# Patient Record
Sex: Male | Born: 1950 | Race: White | Hispanic: No | Marital: Married | Smoking: Never smoker
Health system: Southern US, Community
[De-identification: ages and names within clinical notes are randomized; demographics above are authoritative.]

## PROBLEM LIST (undated history)

## (undated) DIAGNOSIS — K219 Gastro-esophageal reflux disease without esophagitis: Secondary | ICD-10-CM

## (undated) DIAGNOSIS — T7840XA Allergy, unspecified, initial encounter: Secondary | ICD-10-CM

## (undated) DIAGNOSIS — I10 Essential (primary) hypertension: Secondary | ICD-10-CM

## (undated) DIAGNOSIS — J019 Acute sinusitis, unspecified: Secondary | ICD-10-CM

## (undated) DIAGNOSIS — Z8619 Personal history of other infectious and parasitic diseases: Secondary | ICD-10-CM

## (undated) DIAGNOSIS — R05 Cough: Secondary | ICD-10-CM

## (undated) DIAGNOSIS — E785 Hyperlipidemia, unspecified: Secondary | ICD-10-CM

## (undated) DIAGNOSIS — R7309 Other abnormal glucose: Secondary | ICD-10-CM

## (undated) DIAGNOSIS — R059 Cough, unspecified: Secondary | ICD-10-CM

## (undated) DIAGNOSIS — Z87898 Personal history of other specified conditions: Secondary | ICD-10-CM

## (undated) DIAGNOSIS — E119 Type 2 diabetes mellitus without complications: Secondary | ICD-10-CM

## (undated) DIAGNOSIS — D649 Anemia, unspecified: Secondary | ICD-10-CM

## (undated) DIAGNOSIS — J309 Allergic rhinitis, unspecified: Secondary | ICD-10-CM

## (undated) DIAGNOSIS — M2550 Pain in unspecified joint: Secondary | ICD-10-CM

## (undated) DIAGNOSIS — Z8719 Personal history of other diseases of the digestive system: Secondary | ICD-10-CM

## (undated) DIAGNOSIS — K859 Acute pancreatitis without necrosis or infection, unspecified: Secondary | ICD-10-CM

## (undated) DIAGNOSIS — E039 Hypothyroidism, unspecified: Secondary | ICD-10-CM

## (undated) DIAGNOSIS — E669 Obesity, unspecified: Secondary | ICD-10-CM

## (undated) HISTORY — DX: Cough, unspecified: R05.9

## (undated) HISTORY — DX: Hyperlipidemia, unspecified: E78.5

## (undated) HISTORY — DX: Essential (primary) hypertension: I10

## (undated) HISTORY — DX: Other abnormal glucose: R73.09

## (undated) HISTORY — PX: CHOLECYSTECTOMY: SHX55

## (undated) HISTORY — DX: Type 2 diabetes mellitus without complications: E11.9

## (undated) HISTORY — DX: Obesity, unspecified: E66.9

## (undated) HISTORY — DX: Anemia, unspecified: D64.9

## (undated) HISTORY — DX: Acute pancreatitis without necrosis or infection, unspecified: K85.90

## (undated) HISTORY — DX: Cough: R05

## (undated) HISTORY — DX: Acute sinusitis, unspecified: J01.90

## (undated) HISTORY — DX: Allergy, unspecified, initial encounter: T78.40XA

## (undated) HISTORY — DX: Personal history of other infectious and parasitic diseases: Z86.19

## (undated) HISTORY — DX: Allergic rhinitis, unspecified: J30.9

## (undated) HISTORY — DX: Personal history of other specified conditions: Z87.898

## (undated) HISTORY — DX: Gastro-esophageal reflux disease without esophagitis: K21.9

---

## 1998-12-20 ENCOUNTER — Emergency Department (HOSPITAL_COMMUNITY): Admission: EM | Admit: 1998-12-20 | Discharge: 1998-12-20 | Payer: Self-pay | Admitting: Emergency Medicine

## 1998-12-20 ENCOUNTER — Encounter: Payer: Self-pay | Admitting: Emergency Medicine

## 2000-02-09 ENCOUNTER — Ambulatory Visit (HOSPITAL_COMMUNITY): Admission: RE | Admit: 2000-02-09 | Discharge: 2000-02-09 | Payer: Self-pay | Admitting: Gastroenterology

## 2000-02-09 ENCOUNTER — Encounter: Payer: Self-pay | Admitting: Gastroenterology

## 2000-03-22 ENCOUNTER — Encounter: Payer: Self-pay | Admitting: Gastroenterology

## 2000-03-22 ENCOUNTER — Ambulatory Visit (HOSPITAL_COMMUNITY): Admission: RE | Admit: 2000-03-22 | Discharge: 2000-03-22 | Payer: Self-pay | Admitting: Gastroenterology

## 2001-01-10 ENCOUNTER — Encounter: Payer: Self-pay | Admitting: Surgery

## 2001-01-10 ENCOUNTER — Observation Stay (HOSPITAL_COMMUNITY): Admission: RE | Admit: 2001-01-10 | Discharge: 2001-01-11 | Payer: Self-pay | Admitting: Surgery

## 2006-02-28 HISTORY — PX: OTHER SURGICAL HISTORY: SHX169

## 2012-06-04 ENCOUNTER — Telehealth (HOSPITAL_COMMUNITY): Payer: Self-pay | Admitting: Dietician

## 2012-06-04 NOTE — Telephone Encounter (Signed)
Called pt at 1600. Appointment set up for 06/13/12 at 0900.

## 2012-06-04 NOTE — Telephone Encounter (Signed)
Received referral via fax from [facility] (Dr. Rosezetta Schlatter) for dx: obesity, abnormal fasting glucose.

## 2012-06-13 ENCOUNTER — Encounter (HOSPITAL_COMMUNITY): Payer: Self-pay | Admitting: Dietician

## 2012-06-13 DIAGNOSIS — I10 Essential (primary) hypertension: Secondary | ICD-10-CM | POA: Insufficient documentation

## 2012-06-13 DIAGNOSIS — R059 Cough, unspecified: Secondary | ICD-10-CM | POA: Insufficient documentation

## 2012-06-13 DIAGNOSIS — K219 Gastro-esophageal reflux disease without esophagitis: Secondary | ICD-10-CM | POA: Insufficient documentation

## 2012-06-13 DIAGNOSIS — D649 Anemia, unspecified: Secondary | ICD-10-CM | POA: Insufficient documentation

## 2012-06-13 DIAGNOSIS — J309 Allergic rhinitis, unspecified: Secondary | ICD-10-CM | POA: Insufficient documentation

## 2012-06-13 DIAGNOSIS — R05 Cough: Secondary | ICD-10-CM | POA: Insufficient documentation

## 2012-06-13 DIAGNOSIS — J019 Acute sinusitis, unspecified: Secondary | ICD-10-CM | POA: Insufficient documentation

## 2012-06-13 NOTE — Progress Notes (Signed)
Outpatient Initial Nutrition Assessment  Date:06/13/2012   Appt Start Time: 0903  Referring Physician: Dr. Rosezetta Schlatter Reason for Visit: prediabetes, obesity  Nutrition Assessment:  Height: 5\' 7"  (170.2 cm)   Weight: 338 lb (153.316 kg)   IBW: 148# %IBW: 228% UBW: 335# %UBW: 99%  Body mass index is 52.93 kg/(m^2). Classified as extreme obesity, class III. Goal Weight: 304# (10% loss of current body weight) Weight hx: Pt reports UBW between 330-335#. He reports wt of 350# at his PCP appt a few weeks ago was his highest weight.   Estimated nutritional needs: 2300-2400 kcals daily, 123-154 grams protein daily, 2.3-2.4 L fluid daily  PMH:  Past Medical History  Diagnosis Date  . Sinusitis, acute   . HTN (hypertension)   . Cough   . Anemia   . Obesity   . Allergic rhinitis   . GERD (gastroesophageal reflux disease)     Medications:  Current Outpatient Rx  Name  Route  Sig  Dispense  Refill  . amLODipine (NORVASC) 5 MG tablet   Oral   Take 5 mg by mouth daily.         Marland Kitchen aspirin 81 MG tablet   Oral   Take 81 mg by mouth daily.         . benazepril-hydrochlorthiazide (LOTENSIN HCT) 20-12.5 MG per tablet   Oral   Take 1 tablet by mouth daily.         . cetirizine (ZYRTEC) 10 MG tablet   Oral   Take 10 mg by mouth daily.         . Flaxseed, Linseed, (FLAX SEED OIL) 1000 MG CAPS   Oral   Take 1,000 mg by mouth daily.         Marland Kitchen omeprazole (PRILOSEC) 20 MG capsule   Oral   Take 20 mg by mouth 2 (two) times daily.         Marland Kitchen terazosin (HYTRIN) 10 MG capsule   Oral   Take 10 mg by mouth at bedtime.         . thiamine (VITAMIN B-1) 100 MG tablet   Oral   Take 100 mg by mouth daily.           Labs: CMP  No results found for this basename: na, k, cl, co2, glucose, bun, creatinine, calcium, prot, albumin, ast, alt, alkphos, bilitot, gfrnonaa, gfraa    Lipid Panel  No results found for this basename: chol, trig, hdl, cholhdl, vldl, ldlcalc     No results  found for this basename: HGBA1C   No results found for this basename: GLUF, MICROALBUR, LDLCALC, CREATININE    No available labs.   Lifestyle/ social habits: George Christensen is a very pleasant gentleman who lives in the [place] area [place]) with his wife of 40 years, who is present with him today. He has 2 living adult children, one who lives in the [place] country area and the other who lives in [place]. He had a third child who passed away in his 6's. 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 He reports his stress level as "way down" since he retired. He reports he has always worked in high stress jobs and had a high stress level when he was working, especially in public works. He also spends a great deal of time taking care of his parents, who are in their 72's, and drives  them to their various medical appointments. He does not do any structured exercise regularly. He reports that he is active outdoors, participating in farming, hunting, fishing, and restoring old buildings on his property.   Nutrition hx/habits: George Christensen reports that he has been becoming more aware of his eating habits since he was diagnosed with prediabetes. He is very proud of his hypertensive control, reporting he achieved a level of 126/72 at his dentist's office last week. He expresses interest in losing weight and reports that a close friend of his was very successful with Weight Watchers. His beverages mainly consist of water, Gatorade, sweet tea, and cheerwine. He reports that he tries to eat 3 meals a day, but he will skip meals, especially if he is busy with a project during the day. He often eats ice cream at night. He goes out to eat at fast food establishments at least once a week. His wife expresses concern about him trying to eat healthier, as she finds it is more expensive.   Diet recall: Breakfast  (0600-0700): 2 pieces of toast, muscadine juice OR boiled chicken thigh; Lunch (1200-1300): fast food cheeseburger with sweet tea; Dinner: rotisserie chicoken, boxed macaroni and cheese, french fries, sweet tea; Dessert: peach cobbler and scoop of ice cream.   Nutrition Diagnosis: Incosnistent carbohydrate intake r/t nutrition-related knowledge deficit, skipping meals AEB reports of elevated blood glucose.   Nutrition Intervention: Nutrition rx: 1800-2000 NAS, diabetic diet; 3 meals per day; limit 1 starch per meal; limit snacks to fruit, vegetables, or yogurt; limit sweets; low calorie beverages only; physical activity as tolerated  Education/Counseling Provided: Educated pt on principles of diabetic diet and weight amangement. Discussed carbohydrate metabolism in relation to diabetes. Discussed principles of energy expenditure and how changes in diet and physical activity affect weight status. Discussed nutritional content of commonly eaten foods and suggested healthier alternatives Educated pt on plate method, portion sizes, and sources of carbohydrate. Discussed importance of regular meal pattern. Discussed importance of adding sources of whole grains to diet to improve glycemic control. Also encouraged to choose low fat dairy, lean meats, and whole fruits and vegetables more often. Discussed nutritional content of foods commonly eaten and discussed healthier alternatives. Discussed importance of compliance to prevent or prolong diagnosis of diabetes. Reviewed diet recall and discussed way to improve current diet, including providing examples to incoprorate more fruits, vegetables, and low calorie beverages into diet. Discussed specific examples of low-cost ways pt could improve current diet, including incorporating frozen and low sodium vegetables and decreasing portions of meat. Educated pt on importance of physical activity (goal of at least 30 minutes 5 times per week) along with a healthy diet to  achieve weight loss and glycemic goals. Encouraged slow, moderate weight loss of 1-2# per week, or 7-10% of current body weight,and adopting healthy lifestyle changes vs. obtaining a certain body type or weight . Provided plate method, Go With the Whole Grain", '1800 Calorie Diet", and Weight Loss Tips" handouts. Used TeachBack to assess understanding.   Understanding, Motivation, Ability to Follow Recommendations: Expect fair to good compliance.   Monitoring and Evaluation: Goals: 1) 1-2# wt loss per week; 2) Physical activity as tolerated  Recommendations: 1) 1800-2000 kcals daily for weight loss; 2) Break up physical activity into smaller, more frequent sessions; 3) Choose water, low calorie drinks over soda; 4) Add fruit slices to water for added flavor  F/U: 4-8 weeks. Fu/ scheduled for 08/01/12 at 0900.  George Christensen, RD, LDN 06/13/2012  Appt  EndTime: 1108

## 2012-07-24 ENCOUNTER — Telehealth (HOSPITAL_COMMUNITY): Payer: Self-pay | Admitting: Dietician

## 2012-07-24 NOTE — Telephone Encounter (Signed)
Called and left message on pt voicemail at [phone]. Follow-up appointment on 08/01/12 at 0900 must be rescheduled, due to provider scheduling conflict.

## 2012-07-26 NOTE — Telephone Encounter (Signed)
No response from pt within 48 hours. Will keep referral on file.

## 2015-06-29 LAB — HM DIABETES EYE EXAM

## 2016-04-20 LAB — CBC AND DIFFERENTIAL
HCT: 40 % (ref 39–52)
Hemoglobin: 13.5 g/dL (ref 13.0–17.0)
Neutrophils Absolute: 6 /uL
Platelets: 226 10*3/uL (ref 150–399)
WBC: 8.9 10*3/mL

## 2016-04-20 LAB — BASIC METABOLIC PANEL
BUN: 13 mg/dL (ref 4–21)
CREATININE: 0.9 mg/dL
GLUCOSE: 151 mg/dL
Potassium: 4.1 mmol/L (ref 3.4–5.3)
Sodium: 143 mmol/L (ref 137–147)

## 2016-04-20 LAB — LIPID PANEL
Cholesterol: 118 mg/dL (ref 0–200)
HDL: 44 mg/dL (ref 35–70)
LDL Cholesterol: 64 mg/dL
LDl/HDL Ratio: 2.7
Triglycerides: 90 mg/dL (ref 40–160)

## 2016-04-20 LAB — HEPATIC FUNCTION PANEL
ALK PHOS: 100 U/L (ref 25–125)
ALT: 32 U/L
AST: 26 U/L
BILIRUBIN, TOTAL: 0.5 mg/dL

## 2016-04-20 LAB — HEMOGLOBIN A1C: Hemoglobin A1C: 7

## 2016-05-24 DIAGNOSIS — R69 Illness, unspecified: Secondary | ICD-10-CM | POA: Diagnosis not present

## 2016-06-14 ENCOUNTER — Encounter: Payer: Self-pay | Admitting: General Practice

## 2016-07-18 ENCOUNTER — Ambulatory Visit (INDEPENDENT_AMBULATORY_CARE_PROVIDER_SITE_OTHER): Payer: Medicare HMO | Admitting: Family Medicine

## 2016-07-18 ENCOUNTER — Encounter: Payer: Self-pay | Admitting: Family Medicine

## 2016-07-18 VITALS — BP 131/80 | HR 82 | Temp 98.0°F | Resp 16 | Ht 67.0 in | Wt 345.2 lb

## 2016-07-18 DIAGNOSIS — E119 Type 2 diabetes mellitus without complications: Secondary | ICD-10-CM | POA: Diagnosis not present

## 2016-07-18 DIAGNOSIS — E785 Hyperlipidemia, unspecified: Secondary | ICD-10-CM | POA: Diagnosis not present

## 2016-07-18 DIAGNOSIS — E1169 Type 2 diabetes mellitus with other specified complication: Secondary | ICD-10-CM

## 2016-07-18 DIAGNOSIS — I10 Essential (primary) hypertension: Secondary | ICD-10-CM

## 2016-07-18 DIAGNOSIS — Z1211 Encounter for screening for malignant neoplasm of colon: Secondary | ICD-10-CM

## 2016-07-18 LAB — CBC WITH DIFFERENTIAL/PLATELET
Basophils Absolute: 0 10*3/uL (ref 0.0–0.1)
Basophils Relative: 0.3 % (ref 0.0–3.0)
Eosinophils Absolute: 0.2 10*3/uL (ref 0.0–0.7)
Eosinophils Relative: 1.8 % (ref 0.0–5.0)
HCT: 40 % (ref 39.0–52.0)
Hemoglobin: 13.6 g/dL (ref 13.0–17.0)
Lymphocytes Relative: 25 % (ref 12.0–46.0)
Lymphs Abs: 2.3 10*3/uL (ref 0.7–4.0)
MCHC: 33.9 g/dL (ref 30.0–36.0)
MCV: 84.2 fl (ref 78.0–100.0)
Monocytes Absolute: 0.5 10*3/uL (ref 0.1–1.0)
Monocytes Relative: 5.1 % (ref 3.0–12.0)
Neutro Abs: 6.3 10*3/uL (ref 1.4–7.7)
Neutrophils Relative %: 67.8 % (ref 43.0–77.0)
Platelets: 269 10*3/uL (ref 150.0–400.0)
RBC: 4.76 Mil/uL (ref 4.22–5.81)
RDW: 13.9 % (ref 11.5–15.5)
WBC: 9.2 10*3/uL (ref 4.0–10.5)

## 2016-07-18 LAB — BASIC METABOLIC PANEL
BUN: 12 mg/dL (ref 6–23)
CALCIUM: 9.6 mg/dL (ref 8.4–10.5)
CO2: 29 meq/L (ref 19–32)
Chloride: 101 mEq/L (ref 96–112)
Creatinine, Ser: 0.93 mg/dL (ref 0.40–1.50)
GFR: 86.65 mL/min (ref >60.00)
GLUCOSE: 134 mg/dL — AB (ref 70–99)
Potassium: 4.2 mEq/L (ref 3.5–5.1)
Sodium: 138 mEq/L (ref 135–145)

## 2016-07-18 LAB — LIPID PANEL
Cholesterol: 119 mg/dL (ref 0–200)
HDL: 34.5 mg/dL — AB (ref >39.00)
LDL Cholesterol: 51 mg/dL (ref 0–99)
NONHDL: 84.85
TRIGLYCERIDES: 171 mg/dL — AB (ref 0.0–149.0)
Total CHOL/HDL Ratio: 3
VLDL: 34.2 mg/dL (ref 0.0–40.0)

## 2016-07-18 LAB — HEPATIC FUNCTION PANEL
ALBUMIN: 4 g/dL (ref 3.5–5.2)
ALT: 24 U/L (ref 0–53)
AST: 21 U/L (ref 0–37)
Alkaline Phosphatase: 82 U/L (ref 39–117)
BILIRUBIN TOTAL: 0.4 mg/dL (ref 0.2–1.2)
Bilirubin, Direct: 0.1 mg/dL (ref 0.0–0.3)
TOTAL PROTEIN: 6.3 g/dL (ref 6.0–8.3)

## 2016-07-18 LAB — HEMOGLOBIN A1C: HEMOGLOBIN A1C: 7.4 % — AB (ref 4.6–6.5)

## 2016-07-18 LAB — TSH: TSH: 5.34 u[IU]/mL — ABNORMAL HIGH (ref 0.35–4.50)

## 2016-07-18 NOTE — Progress Notes (Signed)
Pre visit review using our clinic review tool, if applicable. No additional management support is needed unless otherwise documented below in the visit note. 

## 2016-07-18 NOTE — Patient Instructions (Signed)
Schedule your complete physical in 3-4 months and your Annual Wellness Visit at the same time [facility] notify you of your lab results and make any changes if needed Please schedule your eye exam at your convenience We'll call Dr Ulyses Amor office and see when you are due for your colonoscopy Try and work on healthy diet and regular exercise- you can do it! Call about 1 month prior to needing refills so we can send them to your new mailorder Call with any questions or concerns Welcome!  We're glad to have you!!!

## 2016-07-18 NOTE — Progress Notes (Signed)
   Subjective:    Patient ID: George Christensen, male    DOB: June 03, 1950, 66 y.o.   MRN: [id number]  HPI New to establish.  Previous PCP- [place]  DM- chronic problem.  On Metformin 1000mg  BID.  On ACE for renal protection.  Last A1C 7.  Pt received card in mail to schedule eye exam w/ Dr Einar Gip.  No numbness/tingling of hands/feet.  No sores or wounds on feet.  Pt reports infrequent symptomatic lows.    HTN- chronic problem, on Amlodipine, Benazepril HCTZ, Terazosin daily.  No CP, SOB above baseline, HAs, visual changes, edema.  Hyperlipidemia- chronic problem, on Crestor 20mg  daily.  Denies abd pain, N/V.  Obesity- pt's BMI is 54.  Not getting any regular exercise or following a particular diet.  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   Review of Systems For ROS see HPI     Objective:   Physical Exam  Constitutional: He is oriented to person, place, and time. He appears well-developed and well-nourished. No distress.  Morbidly obese  HENT:  Head: Normocephalic and atraumatic.  Eyes: Conjunctivae and EOM are normal. Pupils are equal, round, and reactive to light.  Neck: Normal range of motion. Neck supple. No thyromegaly present.  Cardiovascular: Normal rate, regular rhythm, normal heart sounds and intact distal pulses.   No murmur heard. Pulmonary/Chest: Effort normal and breath sounds normal. No respiratory distress.  Abdominal: Soft. Bowel sounds are normal. He exhibits no distension.  Musculoskeletal: He exhibits no edema.  Lymphadenopathy:    He has no cervical adenopathy.  Neurological: He is alert and oriented to person, place, and time. No cranial nerve deficit.  Skin: Skin is warm and dry.  Psychiatric: He has a normal mood and affect. His behavior is normal.  Vitals reviewed.         Assessment & Plan:

## 2016-07-19 ENCOUNTER — Other Ambulatory Visit: Payer: Self-pay | Admitting: Family Medicine

## 2016-07-19 ENCOUNTER — Other Ambulatory Visit: Payer: Self-pay | Admitting: General Practice

## 2016-07-19 DIAGNOSIS — R7989 Other specified abnormal findings of blood chemistry: Secondary | ICD-10-CM

## 2016-07-19 MED ORDER — LEVOTHYROXINE SODIUM 50 MCG PO TABS: 50.0000 ug | ORAL_TABLET | Freq: Every day | ORAL | 1 refills | Status: DC

## 2016-07-21 NOTE — Assessment & Plan Note (Signed)
New to provider, ongoing for pt.  On Crestor 20mg  daily w/o difficulty.  Stressed need for healthy diet and regular exercise.  Check labs.  Adjust meds prn

## 2016-07-21 NOTE — Assessment & Plan Note (Signed)
New to provider, ongoing for pt.  On Metformin 1000mg  BID.  On ACE for renal protection.  Plans to schedule eye exam w/ Dr Einar Gip.  Foot exam done today.  Stressed need for low carb diet, regular exercise.  Check labs.  Adjust meds prn

## 2016-07-21 NOTE — Assessment & Plan Note (Signed)
New to provider, ongoing for pt.  Adequate control today on Amlodipine, Benazepril HCTZ and Terazosin daily.  Asymptomatic w/ exception of baseline SOB on exertion, 'i'm fat'.  Check labs.  No anticipated med changes.  Stressed need for healthy diet, regular exercise, and weight loss.  Will follow.

## 2016-07-21 NOTE — Assessment & Plan Note (Signed)
New to provider, ongoing for pt.  BMI is 54.  He is not getting any formal exercise nor following a particular diet.  Stressed need for both.  Check labs to risk stratify.  Will follow.

## 2016-07-26 ENCOUNTER — Telehealth: Payer: Self-pay | Admitting: Family Medicine

## 2016-07-26 MED ORDER — METFORMIN HCL 1000 MG PO TABS: 1000.0000 mg | ORAL_TABLET | Freq: Two times a day (BID) | ORAL | 1 refills | Status: DC

## 2016-07-26 MED ORDER — ROSUVASTATIN CALCIUM 20 MG PO TABS: 20.0000 mg | ORAL_TABLET | Freq: Every day | ORAL | 1 refills | Status: DC

## 2016-07-26 NOTE — Telephone Encounter (Signed)
Medication filled to pharmacy as requested.   

## 2016-07-26 NOTE — Telephone Encounter (Signed)
Patient requesting refill of the following meds:  metFORMIN (GLUCOPHAGE) 1000 MG tablet  rosuvastatin (CRESTOR) 20 MG tablet  Pharmacy:  [facility], [place] [phone] (Phone) [phone] (Fax)

## 2016-07-28 ENCOUNTER — Telehealth: Payer: Self-pay

## 2016-07-28 NOTE — Telephone Encounter (Signed)
Called patient regarding AWV. No voicemail available. Appointment for AWV with [facility] needs to be rescheduled to "Welcome to Medicare" visit with PCP.

## 2016-07-29 DIAGNOSIS — H524 Presbyopia: Secondary | ICD-10-CM | POA: Diagnosis not present

## 2016-07-29 DIAGNOSIS — E119 Type 2 diabetes mellitus without complications: Secondary | ICD-10-CM | POA: Diagnosis not present

## 2016-07-29 LAB — HM DIABETES EYE EXAM

## 2016-08-04 ENCOUNTER — Ambulatory Visit: Payer: Medicare HMO

## 2016-08-12 ENCOUNTER — Encounter: Payer: Self-pay | Admitting: General Practice

## 2016-08-22 ENCOUNTER — Other Ambulatory Visit (INDEPENDENT_AMBULATORY_CARE_PROVIDER_SITE_OTHER): Payer: Medicare HMO

## 2016-08-22 ENCOUNTER — Other Ambulatory Visit: Payer: Self-pay | Admitting: *Deleted

## 2016-08-22 ENCOUNTER — Encounter: Payer: Self-pay | Admitting: General Practice

## 2016-08-22 DIAGNOSIS — R946 Abnormal results of thyroid function studies: Secondary | ICD-10-CM

## 2016-08-22 DIAGNOSIS — R7989 Other specified abnormal findings of blood chemistry: Secondary | ICD-10-CM

## 2016-08-22 LAB — TSH: TSH: 3.84 u[IU]/mL (ref 0.35–4.50)

## 2016-08-22 MED ORDER — BENAZEPRIL-HYDROCHLOROTHIAZIDE 20-12.5 MG PO TABS: 1.0000 | ORAL_TABLET | Freq: Every day | ORAL | 1 refills | Status: DC

## 2016-08-22 MED ORDER — OMEPRAZOLE 20 MG PO CPDR: 20.0000 mg | DELAYED_RELEASE_CAPSULE | Freq: Two times a day (BID) | ORAL | 1 refills | Status: DC

## 2016-08-22 MED ORDER — AMLODIPINE BESYLATE 5 MG PO TABS: 5.0000 mg | ORAL_TABLET | Freq: Every day | ORAL | 1 refills | Status: DC

## 2016-08-22 MED ORDER — TERAZOSIN HCL 10 MG PO CAPS: 10.0000 mg | ORAL_CAPSULE | Freq: Every day | ORAL | 1 refills | Status: DC

## 2016-08-24 ENCOUNTER — Telehealth: Payer: Self-pay | Admitting: General Practice

## 2016-08-24 MED ORDER — LISINOPRIL-HYDROCHLOROTHIAZIDE 20-12.5 MG PO TABS: 1.0000 | ORAL_TABLET | Freq: Every day | ORAL | 1 refills | Status: DC

## 2016-08-24 NOTE — Telephone Encounter (Signed)
Received a fax from [place]  In regards to benazepril on back order. Per PCP pt was changed to lisinopril hctz 20/12.5mg  1 tablet by mouth daily.   Pt till be informed.

## 2016-08-25 ENCOUNTER — Telehealth: Payer: Self-pay | Admitting: Family Medicine

## 2016-08-25 NOTE — Telephone Encounter (Signed)
Pt called for lab results from Monday, states that he had not heard from anyone. I advised him that his TSH is now normal. Pt asked if he is still to continue medication, I told pt someone would call him back regarding this, please advise.

## 2016-08-25 NOTE — Telephone Encounter (Signed)
Called pt and left a detailed message to continue current levothyroxine dose.

## 2016-09-09 ENCOUNTER — Other Ambulatory Visit: Payer: Self-pay | Admitting: Family Medicine

## 2016-09-12 ENCOUNTER — Other Ambulatory Visit: Payer: Self-pay | Admitting: Emergency Medicine

## 2016-09-12 MED ORDER — LEVOTHYROXINE SODIUM 50 MCG PO TABS: 50.0000 ug | ORAL_TABLET | Freq: Every day | ORAL | 1 refills | Status: DC

## 2016-10-12 ENCOUNTER — Other Ambulatory Visit: Payer: Self-pay | Admitting: Gastroenterology

## 2016-10-12 DIAGNOSIS — Z1211 Encounter for screening for malignant neoplasm of colon: Secondary | ICD-10-CM | POA: Diagnosis not present

## 2016-10-12 DIAGNOSIS — R197 Diarrhea, unspecified: Secondary | ICD-10-CM | POA: Diagnosis not present

## 2016-10-12 DIAGNOSIS — E119 Type 2 diabetes mellitus without complications: Secondary | ICD-10-CM | POA: Diagnosis not present

## 2016-10-24 ENCOUNTER — Telehealth: Payer: Self-pay | Admitting: Family Medicine

## 2016-10-24 NOTE — Telephone Encounter (Signed)
disregard

## 2016-10-26 ENCOUNTER — Encounter: Payer: Self-pay | Admitting: General Practice

## 2016-10-26 ENCOUNTER — Ambulatory Visit (INDEPENDENT_AMBULATORY_CARE_PROVIDER_SITE_OTHER): Payer: Medicare HMO | Admitting: Family Medicine

## 2016-10-26 ENCOUNTER — Encounter: Payer: Self-pay | Admitting: Family Medicine

## 2016-10-26 VITALS — BP 128/82 | HR 81 | Temp 98.1°F | Resp 16 | Ht 67.0 in | Wt 341.4 lb

## 2016-10-26 DIAGNOSIS — E039 Hypothyroidism, unspecified: Secondary | ICD-10-CM | POA: Diagnosis not present

## 2016-10-26 DIAGNOSIS — E119 Type 2 diabetes mellitus without complications: Secondary | ICD-10-CM | POA: Diagnosis not present

## 2016-10-26 LAB — TSH: TSH: 3.46 u[IU]/mL (ref 0.35–4.50)

## 2016-10-26 LAB — BASIC METABOLIC PANEL
BUN: 11 mg/dL (ref 6–23)
CHLORIDE: 101 meq/L (ref 96–112)
CO2: 30 meq/L (ref 19–32)
CREATININE: 0.82 mg/dL (ref 0.40–1.50)
Calcium: 9.4 mg/dL (ref 8.4–10.5)
GFR: 100.12 mL/min (ref >60.00)
Glucose, Bld: 175 mg/dL — ABNORMAL HIGH (ref 70–99)
POTASSIUM: 4.1 meq/L (ref 3.5–5.1)
Sodium: 139 mEq/L (ref 135–145)

## 2016-10-26 LAB — HEMOGLOBIN A1C: HEMOGLOBIN A1C: 7.3 % — AB (ref 4.6–6.5)

## 2016-10-26 MED ORDER — EMPAGLIFLOZIN 10 MG PO TABS: 10.0000 mg | ORAL_TABLET | Freq: Every day | ORAL | 3 refills | Status: DC

## 2016-10-26 MED ORDER — DAPAGLIFLOZIN PROPANEDIOL 5 MG PO TABS: 5.0000 mg | ORAL_TABLET | Freq: Every day | ORAL | 3 refills | Status: DC

## 2016-10-26 NOTE — Assessment & Plan Note (Signed)
Noted on last labs.  Pt reports ongoing fatigue.  Recheck TSH and adjust meds prn.

## 2016-10-26 NOTE — Assessment & Plan Note (Signed)
Chronic problem.  Pt did not tolerate Metformin due to GI side effects.  Will switch to [place] if insurance covers.  UTD on foot exam, eye exam, and on ACE for renal protection.  Stressed need for healthy diet and regular exercise.  Low carb diet info provided for pt.  Will follow closely.

## 2016-10-26 NOTE — Progress Notes (Signed)
   Subjective:    Patient ID: George Christensen, male    DOB: December 16, 1950, 66 y.o.   MRN: [id number]  HPI DM- chronic problem.  Pt's last A1C 7.4  Pt has not been able to tolerate Metformin due to diarrhea.  UTD on foot exam, eye exam.  On ACE for renal protection.  Pt has lost 4 lbs since last visit.  Denies CP, SOB, HAs, visual changes, edema.  No abd pain, N/V.  Denies symptomatic lows.  Hypothyroid- pt reports ongoing fatigue but is not sure if this is due to keeping his grandson all summer.   Review of Systems For ROS see HPI     Objective:   Physical Exam  Constitutional: He is oriented to person, place, and time. He appears well-developed and well-nourished. No distress.  HENT:  Head: Normocephalic and atraumatic.  Eyes: Pupils are equal, round, and reactive to light. Conjunctivae and EOM are normal.  Neck: Normal range of motion. Neck supple. No thyromegaly present.  Cardiovascular: Normal rate, regular rhythm, normal heart sounds and intact distal pulses.   No murmur heard. Pulmonary/Chest: Effort normal and breath sounds normal. No respiratory distress.  Abdominal: Soft. Bowel sounds are normal. He exhibits no distension.  Musculoskeletal: He exhibits no edema.  Lymphadenopathy:    He has no cervical adenopathy.  Neurological: He is alert and oriented to person, place, and time. No cranial nerve deficit.  Skin: Skin is warm and dry.  Psychiatric: He has a normal mood and affect. His behavior is normal.  Vitals reviewed.         Assessment & Plan:

## 2016-10-26 NOTE — Patient Instructions (Signed)
Follow up in 3-4 months to recheck diabetes and cholesterol We'll notify you of your lab results and make any changes if needed Continue to work on healthy diet and regular exercise- you can do it! STOP the Metformin! Start the [place] once daily Call with any questions or concerns Happy Labor Day!!!

## 2016-10-26 NOTE — Addendum Note (Signed)
Addended by: Leonidas Romberg on: 10/26/2016 10:14 AM   Modules accepted: Orders

## 2016-10-26 NOTE — Progress Notes (Signed)
Pre visit review using our clinic review tool, if applicable. No additional management support is needed unless otherwise documented below in the visit note. 

## 2016-10-27 ENCOUNTER — Telehealth: Payer: Self-pay | Admitting: Family Medicine

## 2016-10-27 MED ORDER — GLIPIZIDE ER 5 MG PO TB24: 5.0000 mg | ORAL_TABLET | Freq: Every day | ORAL | 3 refills | Status: DC

## 2016-10-27 NOTE — Telephone Encounter (Signed)
Please advise 

## 2016-10-27 NOTE — Telephone Encounter (Signed)
Pt states that the Rx for [facility] cost over $100.00 and asking if something else could be called in for him to [facility] in [place].

## 2016-10-27 NOTE — Telephone Encounter (Signed)
Glucotrol XL 5 mg daily, #30, 3 refills

## 2016-10-27 NOTE — Telephone Encounter (Signed)
Med filled to pharmacy and pt made aware.

## 2016-11-28 ENCOUNTER — Ambulatory Visit (INDEPENDENT_AMBULATORY_CARE_PROVIDER_SITE_OTHER): Payer: Medicare HMO | Admitting: Family Medicine

## 2016-11-28 ENCOUNTER — Encounter: Payer: Self-pay | Admitting: Family Medicine

## 2016-11-28 VITALS — BP 130/81 | HR 90 | Temp 98.1°F | Resp 16 | Ht 67.0 in | Wt 331.2 lb

## 2016-11-28 DIAGNOSIS — M791 Myalgia, unspecified site: Secondary | ICD-10-CM | POA: Diagnosis not present

## 2016-11-28 DIAGNOSIS — E039 Hypothyroidism, unspecified: Secondary | ICD-10-CM

## 2016-11-28 DIAGNOSIS — R072 Precordial pain: Secondary | ICD-10-CM | POA: Diagnosis not present

## 2016-11-28 DIAGNOSIS — R5383 Other fatigue: Secondary | ICD-10-CM

## 2016-11-28 LAB — CBC WITH DIFFERENTIAL/PLATELET
BASOS ABS: 0.1 10*3/uL (ref 0.0–0.1)
BASOS PCT: 0.6 % (ref 0.0–3.0)
EOS PCT: 1.7 % (ref 0.0–5.0)
Eosinophils Absolute: 0.2 10*3/uL (ref 0.0–0.7)
HEMATOCRIT: 41.9 % (ref 39.0–52.0)
Hemoglobin: 13.8 g/dL (ref 13.0–17.0)
LYMPHS ABS: 1.9 10*3/uL (ref 0.7–4.0)
LYMPHS PCT: 20 % (ref 12.0–46.0)
MCHC: 33 g/dL (ref 30.0–36.0)
MCV: 85 fl (ref 78.0–100.0)
MONOS PCT: 4.6 % (ref 3.0–12.0)
Monocytes Absolute: 0.4 10*3/uL (ref 0.1–1.0)
NEUTROS ABS: 6.8 10*3/uL (ref 1.4–7.7)
NEUTROS PCT: 73.1 % (ref 43.0–77.0)
PLATELETS: 250 10*3/uL (ref 150.0–400.0)
RBC: 4.93 Mil/uL (ref 4.22–5.81)
RDW: 13.6 % (ref 11.5–15.5)
WBC: 9.3 10*3/uL (ref 4.0–10.5)

## 2016-11-28 LAB — B12 AND FOLATE PANEL
Folate: 18.3 ng/mL (ref >5.9)
Vitamin B-12: 381 pg/mL (ref 211–911)

## 2016-11-28 LAB — T4, FREE: Free T4: 0.94 ng/dL (ref 0.60–1.60)

## 2016-11-28 LAB — TSH: TSH: 1.97 u[IU]/mL (ref 0.35–4.50)

## 2016-11-28 LAB — CK: Total CK: 63 U/L (ref 7–232)

## 2016-11-28 LAB — T3, FREE: T3, Free: 3.2 pg/mL (ref 2.3–4.2)

## 2016-11-28 LAB — TROPONIN I: TNIDX: 0 ug/l (ref 0.00–0.06)

## 2016-11-28 MED ORDER — PANTOPRAZOLE SODIUM 40 MG PO TBEC: 40.0000 mg | DELAYED_RELEASE_TABLET | Freq: Every day | ORAL | 3 refills | Status: DC

## 2016-11-28 NOTE — Progress Notes (Signed)
   Subjective:    Patient ID: George Christensen, male    DOB: 02-09-1951, 66 y.o.   MRN: [id number]  HPI Fatigue- 'my get up and go has got up and went'.  Pt and wife report sxs started ~1 week ago.  Pt reports stress levels are 'very high'.  Pt reports he will go 'from burning up to freezing'.  Pt reports vivid, stressful dreams.  Pt was recently dx'd w/ hypothyroid.  + nausea and substernal chest pressure w/ standing- relieved w/ sitting.  Pt reports similar sxs w/ lifting.  Pt has known hiatal hernia- on Omeprazole 20mg  daily.  Denies palpitations.   Review of Systems For ROS see HPI     Objective:   Physical Exam  Constitutional: He is oriented to person, place, and time. He appears well-developed and well-nourished. No distress.  obese  HENT:  Head: Normocephalic and atraumatic.  Eyes: Pupils are equal, round, and reactive to light. Conjunctivae and EOM are normal.  Neck: Normal range of motion. Neck supple. No thyromegaly present.  Cardiovascular: Normal rate, regular rhythm, normal heart sounds and intact distal pulses.   No murmur heard. Pulmonary/Chest: Effort normal and breath sounds normal. No respiratory distress.  Abdominal: Soft. Bowel sounds are normal. He exhibits no distension.  Musculoskeletal: He exhibits no edema.  Lymphadenopathy:    He has no cervical adenopathy.  Neurological: He is alert and oriented to person, place, and time. No cranial nerve deficit.  Skin: Skin is warm and dry.  Psychiatric: His behavior is normal. Thought content normal.  anxious  Vitals reviewed.         Assessment & Plan:  Fatigue- new.  Pt and wife report sxs started ~1 week ago.  Stress levels are very high.  Recent dx of hypothyroid.  Check labs and adjust meds prn.  Check CBC, B12 to determine if metabolic abnormality.  Discussed that this could be stress related.  Pt is hesitant to accept this as a possible cause of his fatigue and myalgias.  Myalgias- new.  Unclear if this is  new or if this is a side effect of statins.  Check CK and determine if he needs a break from statin  CP- new.  Pt has hx of hiatal hernia and GERD.  His current sxs sound more GI related but he has multiple risk factors for CAD- obesity, DM, HTN, hyperlipidemia.  EKG today WNL.  Refer to cards for likely stress test.  Check troponin to determine if he has had a recent cardiac strain.  If troponin is +, pt and wife are agreeable to go straight to ER.  Pt expressed understanding and is in agreement w/ plan.

## 2016-11-28 NOTE — Progress Notes (Signed)
Pre visit review using our clinic review tool, if applicable. No additional management support is needed unless otherwise documented below in the visit note. 

## 2016-11-28 NOTE — Patient Instructions (Addendum)
We will notify you of your lab results and determine the next steps If your troponin level is elevated (a test that checks strain on your heart), you will go straight to the ER If the troponin is negative, we will have you see cardiology as an outpatient for a likely stress test STOP the Omeprazole START the Protonix daily Make sure you are eating regularly to avoid your stomach being empty- this worsens acid reflux Call with any questions or concerns Hang in there!!!

## 2016-11-28 NOTE — Assessment & Plan Note (Signed)
Check labs to determine if this is the cause of pt's recent fatigue.  Adjust meds prn.

## 2016-11-29 ENCOUNTER — Encounter (HOSPITAL_COMMUNITY): Payer: Self-pay | Admitting: *Deleted

## 2016-11-30 DIAGNOSIS — R69 Illness, unspecified: Secondary | ICD-10-CM | POA: Diagnosis not present

## 2016-12-02 ENCOUNTER — Encounter (HOSPITAL_COMMUNITY)
Admission: RE | Disposition: A | Discharge status: Home / Self Care | Payer: Self-pay | Source: Ambulatory Visit | Attending: Gastroenterology

## 2016-12-02 ENCOUNTER — Ambulatory Visit (HOSPITAL_COMMUNITY)
Admission: RE | Admit: 2016-12-02 | Discharge: 2016-12-02 | Disposition: A | Discharge status: Home / Self Care | Payer: Medicare HMO | Source: Ambulatory Visit | Attending: Gastroenterology | Admitting: Gastroenterology

## 2016-12-02 ENCOUNTER — Ambulatory Visit (HOSPITAL_COMMUNITY): Payer: Medicare HMO | Admitting: Anesthesiology

## 2016-12-02 ENCOUNTER — Encounter (HOSPITAL_COMMUNITY): Payer: Self-pay

## 2016-12-02 DIAGNOSIS — Z538 Procedure and treatment not carried out for other reasons: Secondary | ICD-10-CM | POA: Insufficient documentation

## 2016-12-02 DIAGNOSIS — J309 Allergic rhinitis, unspecified: Secondary | ICD-10-CM | POA: Diagnosis not present

## 2016-12-02 DIAGNOSIS — E039 Hypothyroidism, unspecified: Secondary | ICD-10-CM | POA: Diagnosis not present

## 2016-12-02 DIAGNOSIS — Z9049 Acquired absence of other specified parts of digestive tract: Secondary | ICD-10-CM | POA: Diagnosis not present

## 2016-12-02 DIAGNOSIS — R197 Diarrhea, unspecified: Secondary | ICD-10-CM | POA: Insufficient documentation

## 2016-12-02 DIAGNOSIS — Z6841 Body Mass Index (BMI) 40.0 and over, adult: Secondary | ICD-10-CM | POA: Diagnosis not present

## 2016-12-02 DIAGNOSIS — R072 Precordial pain: Secondary | ICD-10-CM | POA: Diagnosis not present

## 2016-12-02 DIAGNOSIS — Z79899 Other long term (current) drug therapy: Secondary | ICD-10-CM | POA: Diagnosis not present

## 2016-12-02 DIAGNOSIS — E119 Type 2 diabetes mellitus without complications: Secondary | ICD-10-CM | POA: Insufficient documentation

## 2016-12-02 DIAGNOSIS — I10 Essential (primary) hypertension: Secondary | ICD-10-CM | POA: Diagnosis not present

## 2016-12-02 DIAGNOSIS — K219 Gastro-esophageal reflux disease without esophagitis: Secondary | ICD-10-CM | POA: Insufficient documentation

## 2016-12-02 DIAGNOSIS — Z7984 Long term (current) use of oral hypoglycemic drugs: Secondary | ICD-10-CM | POA: Diagnosis not present

## 2016-12-02 HISTORY — DX: Personal history of other diseases of the digestive system: Z87.19

## 2016-12-02 HISTORY — DX: Hypothyroidism, unspecified: E03.9

## 2016-12-02 HISTORY — DX: Pain in unspecified joint: M25.50

## 2016-12-02 LAB — GLUCOSE, CAPILLARY: Glucose-Capillary: 135 mg/dL — ABNORMAL HIGH (ref 65–99)

## 2016-12-02 SURGERY — CANCELLED PROCEDURE

## 2016-12-02 MED ORDER — PROPOFOL 10 MG/ML IV BOLUS
INTRAVENOUS | Status: AC
  Filled 2016-12-02: qty 40

## 2016-12-02 MED ORDER — LACTATED RINGERS IV SOLN
INTRAVENOUS | Status: DC
  Administered 2016-12-02: 1000 mL via INTRAVENOUS

## 2016-12-02 MED ORDER — SODIUM CHLORIDE 0.9 % IV SOLN: INTRAVENOUS | Status: DC

## 2016-12-02 SURGICAL SUPPLY — 21 items

## 2016-12-02 NOTE — H&P (Signed)
George Christensen HPI: At this time the patient denies any problems with nausea, vomiting, fevers, chills, abdominal pain, diarrhea, constipation, hematochezia, melena, GERD, or dysphagia. The patient denies any known family history of colon cancers. No complaints of chest pain, SOB, MI, or sleep apnea. Ten years ago the patient's colonoscopy was normal. In May he was noted to have hypothyroidism and at that time he was started on levothyroxine, rosuvastatin, and metformin. Shortly afterwards he started to experience issues with fatigue and loose stools. The first bowel movement, which is approximately once per day, it is solid. The subsequent movements are then loose. After three loose bowel movements he will take an antidiarrheal. He is s/p cholecystectomy, but he did not have any issues with diarrhea.   Past Medical History:  Diagnosis Date  . Abnormal glucose   . Allergic rhinitis   . Allergy   . Anemia    3 yrs ago  . Cough    occ in am yellow green sputum  . Diabetes mellitus without complication ([place])    type 2  . GERD (gastroesophageal reflux disease)   . H/O dizziness   . History of chicken pox   . History of hiatal hernia   . HTN (hypertension)   . Hyperlipidemia   . Hypothyroidism   . Joint pain    hands  . Obesity   . Sinusitis, acute     Past Surgical History:  Procedure Laterality Date  . CHOLECYSTECTOMY    . colonscopy  2008   with polyp removed    Family History  Problem Relation Age of Onset  . Diabetes Mother   . Hypertension Mother   . Memory loss Mother   . Diabetes Father   . Hypertension Father   . Lung disease Father   . Stroke Father     Social History:  reports that he has never smoked. He has never used smokeless tobacco. He reports that he does not drink alcohol or use drugs.  Allergies: No Known Allergies  Medications:  Scheduled:  Continuous: . sodium chloride      No results found for this or any previous visit (from the past 24  hour(s)).   No results found.  ROS:  As stated above in the HPI otherwise negative.  Blood pressure (!) 176/97, pulse 85, temperature 97.6 F (36.4 C), temperature source Oral, resp. rate 14, height 5\' 7"  (1.702 m), weight (!) 150.1 kg (331 lb), SpO2 98 %.    PE: Gen: NAD, Alert and Oriented HEENT:  [place]/AT, EOMI Neck: Supple, no LAD Lungs: CTA Bilaterally CV: RRR without M/G/R ABM: Soft, NTND, +BS Ext: No C/C/E  Assessment/Plan: 1) Screening colonoscopy. 2) Diarrhea.   He notices that his symptoms have worsened since being on his new medications and a culprit is metformin. I asked him to hold the medication and to call me in Monday. I reviewed his blood work and his blood glucose was not significantly elevated off of metformin. If this maneuver does not return him back to his baseline, I will obtain biopsies during the colonoscopy to evaluate for microscopic colitis. If there is no evidence of microscopic colitis, I will treat him for cholerrhetic diarrhea.   George Christensen D 12/02/2016, 9:46 AM

## 2016-12-02 NOTE — Anesthesia Preprocedure Evaluation (Addendum)
Anesthesia Evaluation  Patient identified by MRN, date of birth, ID band Patient awake    Reviewed: Allergy & Precautions, NPO status , Patient's Chart, lab work & pertinent test results  History of Anesthesia Complications Negative for: history of anesthetic complications  Airway Mallampati: II  TM Distance: >3 FB Neck ROM: Full    Dental  (+) Dental Advisory Given   Pulmonary neg pulmonary ROS,    breath sounds clear to auscultation       Cardiovascular hypertension, Pt. on medications + angina (substernal chest tightness with exertion, especially with walking to mailbox) with exertion  Rhythm:Regular Rate:Normal     Neuro/Psych negative neurological ROS     GI/Hepatic Neg liver ROS, hiatal hernia, GERD  Medicated,  Endo/Other  diabetes (glu 135), Oral Hypoglycemic AgentsHypothyroidism Morbid obesity  Renal/GU negative Renal ROS     Musculoskeletal   Abdominal (+) + obese,   Peds  Hematology negative hematology ROS (+)   Anesthesia Other Findings   Reproductive/Obstetrics                            Anesthesia Physical Anesthesia Plan  ASA: III  Anesthesia Plan:    Post-op Pain Management:    Induction:   PONV Risk Score and Plan:   Airway Management Planned:   Additional Equipment:   Intra-op Plan:   Post-operative Plan:   Informed Consent:   Plan Discussed with: CRNA and Surgeon  Anesthesia Plan Comments: (Pt with substernal chest pain with any exertion, eases with rest.  He has stress test booked in November.  Will postpone elective endoscopy pending stress test results. Pt is dissatisfied, yet understands. Dr. Benson Norway agrees.)        Anesthesia Quick Evaluation

## 2016-12-02 NOTE — Progress Notes (Addendum)
Procedure canceled per anesthesia.Patient given verbal instructions regarding rescheduling procedure.Patient verbalize understanding for pending cardiac stress test results.

## 2016-12-06 ENCOUNTER — Other Ambulatory Visit: Payer: Self-pay | Admitting: Family Medicine

## 2016-12-06 DIAGNOSIS — R079 Chest pain, unspecified: Secondary | ICD-10-CM

## 2016-12-09 ENCOUNTER — Ambulatory Visit: Payer: Medicare HMO | Admitting: Family Medicine

## 2016-12-12 ENCOUNTER — Ambulatory Visit (INDEPENDENT_AMBULATORY_CARE_PROVIDER_SITE_OTHER): Payer: Medicare HMO | Admitting: Family Medicine

## 2016-12-12 ENCOUNTER — Encounter: Payer: Self-pay | Admitting: General Practice

## 2016-12-12 ENCOUNTER — Encounter: Payer: Self-pay | Admitting: Family Medicine

## 2016-12-12 VITALS — BP 130/80 | HR 88 | Temp 97.9°F | Resp 16 | Ht 67.0 in | Wt 334.5 lb

## 2016-12-12 DIAGNOSIS — E785 Hyperlipidemia, unspecified: Secondary | ICD-10-CM

## 2016-12-12 DIAGNOSIS — Z0001 Encounter for general adult medical examination with abnormal findings: Secondary | ICD-10-CM | POA: Diagnosis not present

## 2016-12-12 DIAGNOSIS — R0789 Other chest pain: Secondary | ICD-10-CM

## 2016-12-12 DIAGNOSIS — Z125 Encounter for screening for malignant neoplasm of prostate: Secondary | ICD-10-CM

## 2016-12-12 DIAGNOSIS — I10 Essential (primary) hypertension: Secondary | ICD-10-CM

## 2016-12-12 DIAGNOSIS — Z0000 Encounter for general adult medical examination without abnormal findings: Secondary | ICD-10-CM

## 2016-12-12 DIAGNOSIS — E1169 Type 2 diabetes mellitus with other specified complication: Secondary | ICD-10-CM

## 2016-12-12 LAB — HEPATIC FUNCTION PANEL
ALBUMIN: 4 g/dL (ref 3.5–5.2)
ALK PHOS: 90 U/L (ref 39–117)
ALT: 25 U/L (ref 0–53)
AST: 23 U/L (ref 0–37)
Bilirubin, Direct: 0.1 mg/dL (ref 0.0–0.3)
TOTAL PROTEIN: 6.4 g/dL (ref 6.0–8.3)
Total Bilirubin: 0.4 mg/dL (ref 0.2–1.2)

## 2016-12-12 LAB — BASIC METABOLIC PANEL
BUN: 18 mg/dL (ref 6–23)
CALCIUM: 9.4 mg/dL (ref 8.4–10.5)
CO2: 29 meq/L (ref 19–32)
Chloride: 102 mEq/L (ref 96–112)
Creatinine, Ser: 0.92 mg/dL (ref 0.40–1.50)
GFR: 87.63 mL/min (ref >60.00)
GLUCOSE: 143 mg/dL — AB (ref 70–99)
POTASSIUM: 4.1 meq/L (ref 3.5–5.1)
SODIUM: 139 meq/L (ref 135–145)

## 2016-12-12 LAB — LIPID PANEL
CHOLESTEROL: 114 mg/dL (ref 0–200)
HDL: 38.8 mg/dL — ABNORMAL LOW (ref >39.00)
LDL CALC: 56 mg/dL (ref 0–99)
NonHDL: 75.46
TRIGLYCERIDES: 98 mg/dL (ref 0.0–149.0)
Total CHOL/HDL Ratio: 3
VLDL: 19.6 mg/dL (ref 0.0–40.0)

## 2016-12-12 LAB — PSA, MEDICARE: PSA: 2.53 ng/mL (ref 0.10–4.00)

## 2016-12-12 NOTE — Progress Notes (Signed)
   Subjective:    Patient ID: George Christensen, male    DOB: 06-12-1950, 66 y.o.   MRN: [id number]  HPI Here today for Welcome to Medicare CPE.  Risk Factors: DM- chronic problem, last A1C 7.3.  On Glipizide XL 5mg  daily.  Intolerant to Metformin.  UTD on eye exam, foot exam.  On ACE for renal protection. Hyperlipidemia- chronic problem, on Crestor 20mg  daily HTN- chronic problem, on Lisinopril HCTZ, Terazosin, Amlodipine w/ adequate control Physical Activity: limited Fall Risk: low Depression: denies Hearing: normal to conversational tones, decreased to whispered voices at 6 ft ADL's: independent Cognitive: normal linear thought process, memory and attention intact Home Safety: safe at home, lives w/ wife Height, Weight, BMI, Visual Acuity: see vitals, vision corrected to 20/20 w/ glasses Counseling: Colonoscopy scheduled w/ Dr Benson Norway (pending cardiology work up), due for Prevnar- declines.  Declines flu. Health Care POA/Living Will- pt does not have this, wants to discuss w/ wife Labs Ordered: See A&P Care Plan: See A&P    Review of Systems Patient reports no vision/hearing changes, anorexia, fever ,adenopathy, persistant/recurrent hoarseness, swallowing issues, palpitations, edema, persistant/recurrent cough, hemoptysis, dyspnea (rest,exertional, paroxysmal nocturnal), gastrointestinal  bleeding (melena, rectal bleeding), abdominal pain, excessive heart burn, GU symptoms (dysuria, hematuria, voiding/incontinence issues) syncope, focal weakness, memory loss, numbness & tingling, skin/hair/nail changes, depression, anxiety, abnormal bruising/bleeding, musculoskeletal symptoms/signs.   + chest tightness- cardiology appt pending and stress test ordered    Objective:   Physical Exam General Appearance:    Alert, cooperative, no distress, appears stated age, morbidly obese  Head:    Normocephalic, without obvious abnormality, atraumatic  Eyes:    PERRL, conjunctiva/corneas clear, EOM's  intact, fundi    benign, both eyes       Ears:    Normal TM's and external ear canals, both ears  Nose:   Nares normal, septum midline, mucosa normal, no drainage   or sinus tenderness  Throat:   Lips, mucosa, and tongue normal; teeth and gums normal  Neck:   Supple, symmetrical, trachea midline, no adenopathy;       thyroid:  No enlargement/tenderness/nodules  Back:     Symmetric, no curvature, ROM normal, no CVA tenderness  Lungs:     Clear to auscultation bilaterally, respirations unlabored  Chest wall:    No tenderness or deformity  Heart:    Regular rate and rhythm, S1 and S2 normal, no murmur, rub   or gallop  Abdomen:     Soft, non-tender, bowel sounds active all four quadrants,    no masses, no organomegaly  Genitalia:    Deferred at pt's request  Rectal:    Extremities:   Extremities normal, atraumatic, no cyanosis or edema  Pulses:   2+ and symmetric all extremities  Skin:   Skin color, texture, turgor normal, no rashes or lesions  Lymph nodes:   Cervical, supraclavicular, and axillary nodes normal  Neurologic:   CNII-XII intact. Normal strength, sensation and reflexes      throughout          Assessment & Plan:

## 2016-12-12 NOTE — Assessment & Plan Note (Signed)
Ongoing issue for pt.  Stressed need for healthy diet and regular exercise.  Check labs to risk stratify.  Will follow 

## 2016-12-12 NOTE — Assessment & Plan Note (Signed)
Chronic problem.  Tolerating statin w/o difficulty.  Check labs.  Adjust meds prn  

## 2016-12-12 NOTE — Patient Instructions (Signed)
Follow up in January to recheck diabetes We'll notify you of your lab results and make any changes if needed Someone should call you with your stress test Continue to work on healthy diet and regular exercise- you can do it! If you have worsening chest pain or pressure- please let us know right away or go to the ER Call with any questions or concerns Happy Fall!!!

## 2016-12-12 NOTE — Assessment & Plan Note (Signed)
Pt's PE WNL w/ exception of morbid obesity.  Has colonoscopy scheduled w/ Dr Benson Norway.  Refuses Prevnar and flu shots.  Declines GU exam today.  Check PSA level and remainder of labs.  Anticipatory guidance provided.

## 2016-12-12 NOTE — Assessment & Plan Note (Signed)
Chronic problem.  Adequate control today.  Continues to have chest tightness with standing or exertion- concerning for angina.  Pt has referral to cards and stress test ordered.  Check labs.  No anticipated med changes.  Will follow.

## 2016-12-14 ENCOUNTER — Other Ambulatory Visit: Payer: Self-pay | Admitting: General Practice

## 2016-12-14 MED ORDER — GLIPIZIDE ER 5 MG PO TB24: 5.0000 mg | ORAL_TABLET | Freq: Every day | ORAL | 1 refills | Status: DC

## 2016-12-19 ENCOUNTER — Ambulatory Visit (HOSPITAL_COMMUNITY): Payer: Medicare HMO | Attending: Cardiology

## 2016-12-19 DIAGNOSIS — R079 Chest pain, unspecified: Secondary | ICD-10-CM

## 2016-12-19 MED ORDER — REGADENOSON 0.4 MG/5ML IV SOLN
0.4000 mg | Freq: Once | INTRAVENOUS | Status: AC
  Administered 2016-12-19: 0.4 mg via INTRAVENOUS

## 2016-12-19 MED ORDER — TECHNETIUM TC 99M TETROFOSMIN IV KIT
32.9000 | PACK | Freq: Once | INTRAVENOUS | Status: AC | PRN
  Administered 2016-12-19: 32.9 via INTRAVENOUS
  Filled 2016-12-19: qty 33

## 2016-12-20 ENCOUNTER — Telehealth: Payer: Self-pay | Admitting: Family Medicine

## 2016-12-20 ENCOUNTER — Ambulatory Visit (HOSPITAL_COMMUNITY): Payer: Medicare HMO | Attending: Cardiology

## 2016-12-20 LAB — MYOCARDIAL PERFUSION IMAGING
CHL CUP NUCLEAR SDS: 5
CHL CUP NUCLEAR SRS: 3
CHL CUP NUCLEAR SSS: 8
LHR: 0.38
LV sys vol: 32 mL
LVDIAVOL: 101 mL (ref 62–150)
NUC STRESS TID: 0.85
Peak HR: 92 {beats}/min
Rest HR: 69 {beats}/min

## 2016-12-20 MED ORDER — GLIPIZIDE ER 5 MG PO TB24: 5.0000 mg | ORAL_TABLET | Freq: Every day | ORAL | 1 refills | Status: DC

## 2016-12-20 MED ORDER — TECHNETIUM TC 99M TETROFOSMIN IV KIT
32.3000 | PACK | Freq: Once | INTRAVENOUS | Status: AC | PRN
  Administered 2016-12-20: 32.3 via INTRAVENOUS
  Filled 2016-12-20: qty 33

## 2016-12-20 NOTE — Telephone Encounter (Signed)
Medication filled to pharmacy as requested.   

## 2016-12-20 NOTE — Telephone Encounter (Signed)
Requesting 90 day refill of glipiZIDE (GLUCOTROL XL) 5 MG 24 hr tablet.  Pharmacy:  [facility], [place] [phone] (Phone) [phone] (Fax)

## 2016-12-31 DIAGNOSIS — R69 Illness, unspecified: Secondary | ICD-10-CM | POA: Diagnosis not present

## 2017-01-03 ENCOUNTER — Encounter: Payer: Self-pay | Admitting: Internal Medicine

## 2017-01-03 ENCOUNTER — Ambulatory Visit (INDEPENDENT_AMBULATORY_CARE_PROVIDER_SITE_OTHER): Payer: Medicare HMO | Admitting: Internal Medicine

## 2017-01-03 VITALS — BP 140/60 | HR 75 | Ht 66.0 in | Wt 347.0 lb

## 2017-01-03 DIAGNOSIS — R079 Chest pain, unspecified: Secondary | ICD-10-CM

## 2017-01-03 DIAGNOSIS — I10 Essential (primary) hypertension: Secondary | ICD-10-CM

## 2017-01-03 DIAGNOSIS — E782 Mixed hyperlipidemia: Secondary | ICD-10-CM | POA: Diagnosis not present
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
## 2017-01-03 NOTE — Patient Instructions (Signed)
Dr Hilty recommends that you follow-up with him as needed. 

## 2017-01-09 ENCOUNTER — Encounter (HOSPITAL_COMMUNITY): Payer: Self-pay | Admitting: Cardiology

## 2017-01-09 ENCOUNTER — Emergency Department (HOSPITAL_COMMUNITY): Payer: Medicare HMO

## 2017-01-09 ENCOUNTER — Emergency Department (HOSPITAL_COMMUNITY)
Admission: EM | Admit: 2017-01-09 | Discharge: 2017-01-09 | Disposition: A | Discharge status: Home / Self Care | Payer: Medicare HMO | Attending: Emergency Medicine | Admitting: Emergency Medicine

## 2017-01-09 ENCOUNTER — Telehealth: Payer: Self-pay | Admitting: Family Medicine

## 2017-01-09 DIAGNOSIS — I10 Essential (primary) hypertension: Secondary | ICD-10-CM | POA: Diagnosis not present

## 2017-01-09 DIAGNOSIS — Z79899 Other long term (current) drug therapy: Secondary | ICD-10-CM | POA: Diagnosis not present

## 2017-01-09 DIAGNOSIS — Z7984 Long term (current) use of oral hypoglycemic drugs: Secondary | ICD-10-CM | POA: Diagnosis not present

## 2017-01-09 DIAGNOSIS — E039 Hypothyroidism, unspecified: Secondary | ICD-10-CM | POA: Insufficient documentation

## 2017-01-09 DIAGNOSIS — E119 Type 2 diabetes mellitus without complications: Secondary | ICD-10-CM | POA: Diagnosis not present

## 2017-01-09 DIAGNOSIS — R1010 Upper abdominal pain, unspecified: Secondary | ICD-10-CM | POA: Diagnosis not present

## 2017-01-09 DIAGNOSIS — K8500 Idiopathic acute pancreatitis without necrosis or infection: Secondary | ICD-10-CM | POA: Diagnosis not present

## 2017-01-09 DIAGNOSIS — Z7982 Long term (current) use of aspirin: Secondary | ICD-10-CM | POA: Insufficient documentation

## 2017-01-09 LAB — COMPREHENSIVE METABOLIC PANEL
ALT: 17 U/L (ref 17–63)
AST: 18 U/L (ref 15–41)
Albumin: 3.5 g/dL (ref 3.5–5.0)
Alkaline Phosphatase: 98 U/L (ref 38–126)
Anion gap: 9 (ref 5–15)
BILIRUBIN TOTAL: 0.7 mg/dL (ref 0.3–1.2)
BUN: 12 mg/dL (ref 6–20)
CO2: 29 mmol/L (ref 22–32)
CREATININE: 0.95 mg/dL (ref 0.61–1.24)
Calcium: 9.1 mg/dL (ref 8.9–10.3)
Chloride: 100 mmol/L — ABNORMAL LOW (ref 101–111)
GFR calc Af Amer: >60 mL/min (ref >60)
Glucose, Bld: 144 mg/dL — ABNORMAL HIGH (ref 65–99)
Potassium: 3.6 mmol/L (ref 3.5–5.1)
Sodium: 138 mmol/L (ref 135–145)
TOTAL PROTEIN: 7.1 g/dL (ref 6.5–8.1)

## 2017-01-09 LAB — URINALYSIS, ROUTINE W REFLEX MICROSCOPIC
BILIRUBIN URINE: NEGATIVE
Bacteria, UA: NONE SEEN
Glucose, UA: NEGATIVE mg/dL
Hgb urine dipstick: NEGATIVE
Ketones, ur: 5 mg/dL — AB
Nitrite: NEGATIVE
PH: 6 (ref 5.0–8.0)
Protein, ur: NEGATIVE mg/dL

## 2017-01-09 LAB — CBC WITH DIFFERENTIAL/PLATELET
Basophils Absolute: 0 K/uL (ref 0.0–0.1)
Basophils Relative: 0 %
Eosinophils Absolute: 0.4 K/uL (ref 0.0–0.7)
Eosinophils Relative: 3 %
HCT: 38.9 % — ABNORMAL LOW (ref 39.0–52.0)
Hemoglobin: 12.8 g/dL — ABNORMAL LOW (ref 13.0–17.0)
Lymphocytes Relative: 12 %
Lymphs Abs: 1.6 K/uL (ref 0.7–4.0)
MCH: 28.5 pg (ref 26.0–34.0)
MCHC: 32.9 g/dL (ref 30.0–36.0)
MCV: 86.6 fL (ref 78.0–100.0)
Monocytes Absolute: 0.9 K/uL (ref 0.1–1.0)
Monocytes Relative: 7 %
Neutro Abs: 10 K/uL — ABNORMAL HIGH (ref 1.7–7.7)
Neutrophils Relative %: 78 %
Platelets: 212 K/uL (ref 150–400)
RBC: 4.49 MIL/uL (ref 4.22–5.81)
RDW: 14 % (ref 11.5–15.5)
WBC: 12.9 K/uL — ABNORMAL HIGH (ref 4.0–10.5)

## 2017-01-09 LAB — TROPONIN I: Troponin I: <0.03 ng/mL (ref <0.03)

## 2017-01-09 LAB — POC OCCULT BLOOD, ED: Fecal Occult Bld: NEGATIVE

## 2017-01-09 LAB — LIPASE, BLOOD: Lipase: 33 U/L (ref 11–51)

## 2017-01-09 MED ORDER — GI COCKTAIL ~~LOC~~
30.0000 mL | Freq: Once | ORAL | Status: AC
  Administered 2017-01-09: 30 mL via ORAL
  Filled 2017-01-09: qty 30

## 2017-01-09 MED ORDER — IOPAMIDOL (ISOVUE-300) INJECTION 61%
100.0000 mL | Freq: Once | INTRAVENOUS | Status: AC | PRN
  Administered 2017-01-09: 100 mL via INTRAVENOUS

## 2017-01-09 MED ORDER — FAMOTIDINE 20 MG PO TABS: 20.0000 mg | ORAL_TABLET | Freq: Two times a day (BID) | ORAL | 0 refills | Status: DC

## 2017-01-09 MED ORDER — ESOMEPRAZOLE MAGNESIUM 40 MG PO CPDR: 40.0000 mg | DELAYED_RELEASE_CAPSULE | Freq: Every day | ORAL | 0 refills | Status: DC

## 2017-01-09 MED ORDER — HYDROCODONE-ACETAMINOPHEN 5-325 MG PO TABS: 1.0000 | ORAL_TABLET | ORAL | 0 refills | Status: DC | PRN

## 2017-01-09 NOTE — ED Provider Notes (Signed)
[facility] EMERGENCY DEPARTMENT Provider Note   CSN: [id number] Arrival date & time: 01/09/17  4665     History   Chief Complaint Chief Complaint  Patient presents with  . Abdominal Pain    HPI George Christensen is a 66 y.o. male.  Patient is a 66 year old male who presents to the emergency department with a complaint of abdominal pain.  Patient states that this problem started November 9 at approximately 2:30 AM.  The patient states that the pain was somewhat mild, he was able to turn over and go back to sleep, however at 3:30 in the morning he was again awakened with the pain that he says was probably a 3-4/10 pain following this the pain did not seem to ever completely go away.  He tried Pepto-Bismol but this did not seem to help.  Later during the day on November 9 the patient states that he developed some chills, he did not want to eat, and he generally did not feel well.  He had some of the same symptoms on the following days.  There is no diarrhea, no vomiting, and no constipation.  He also tried some Aleve which seemed to help the pain.  He states that before he started taking the Pepto-Bismol his stools were normal.  After the Pepto-Bismol his stools were dark but not tarry or black.  He has not had this situation in the past.  The patient has had gallbladder surgery several years ago, no other abdominal surgeries.  The patient is not treated for any abdominal issues.  Patient is also had cardiac workup in the past and it was normal.      Past Medical History:  Diagnosis Date  . Abnormal glucose   . Allergic rhinitis   . Allergy   . Anemia    3 yrs ago  . Cough    occ in am yellow green sputum  . Diabetes mellitus without complication ([place])    type 2  . GERD (gastroesophageal reflux disease)   . H/O dizziness   . History of chicken pox   . History of hiatal hernia   . HTN (hypertension)   . Hyperlipidemia   . Hypothyroidism   . Joint pain    hands  . Obesity     . Sinusitis, acute     Patient Active Problem List   Diagnosis Date Noted  . Chest pain 01/03/2017  . Mixed hyperlipidemia 01/03/2017  . Physical exam 12/12/2016  . Hypothyroid 10/26/2016  . Diabetes mellitus without complication ([place]) 99/35/7017  . Hyperlipidemia associated with type 2 diabetes mellitus ([place]) 07/18/2016  . Sinusitis, acute   . Essential hypertension   . Cough   . Anemia   . Morbid obesity ([place])   . Allergic rhinitis   . GERD (gastroesophageal reflux disease)     Past Surgical History:  Procedure Laterality Date  . CHOLECYSTECTOMY    . colonscopy  2008   with polyp removed       Home Medications    Prior to Admission medications   Medication Sig Start Date End Date Taking? Authorizing Provider  amLODipine (NORVASC) 5 MG tablet Take 1 tablet (5 mg total) by mouth daily. 08/22/16   Midge Minium, MD  aspirin 81 MG tablet Take 81 mg by mouth daily.    [provider]  cetirizine (ZYRTEC) 10 MG tablet Take 10 mg by mouth daily.    [provider]  Flaxseed, Linseed, (FLAX SEED OIL) 1300 MG  CAPS Take 1,300 mg by mouth daily.     [provider]  glipiZIDE (GLUCOTROL XL) 5 MG 24 hr tablet Take 1 tablet (5 mg total) by mouth daily with breakfast. 12/20/16   Midge Minium, MD  levothyroxine (SYNTHROID, LEVOTHROID) 50 MCG tablet Take 1 tablet (50 mcg total) by mouth daily. 09/12/16   Midge Minium, MD  lisinopril-hydrochlorothiazide (PRINZIDE,ZESTORETIC) 20-12.5 MG tablet Take 1 tablet by mouth daily. 08/24/16   Midge Minium, MD  pantoprazole (PROTONIX) 40 MG tablet Take 1 tablet (40 mg total) by mouth daily. 11/28/16   Midge Minium, MD  rosuvastatin (CRESTOR) 20 MG tablet Take 1 tablet (20 mg total) by mouth daily. 07/26/16   Midge Minium, MD  terazosin (HYTRIN) 10 MG capsule Take 1 capsule (10 mg total) by mouth at bedtime. 08/22/16   Midge Minium, MD    Family History Family History   Problem Relation Age of Onset  . Diabetes Mother   . Hypertension Mother   . Memory loss Mother   . Diabetes Father   . Hypertension Father   . Lung disease Father   . Stroke Father     Social History Social History   Tobacco Use  . Smoking status: Never Smoker  . Smokeless tobacco: Never Used  Substance Use Topics  . Alcohol use: No  . Drug use: No     Allergies   Patient has no known allergies.   Review of Systems Review of Systems  Constitutional: Positive for appetite change and chills. Negative for activity change and fever.       All ROS Neg except as noted in HPI  HENT: Negative for nosebleeds.   Eyes: Negative for photophobia and discharge.  Respiratory: Negative for cough, shortness of breath and wheezing.   Cardiovascular: Negative for chest pain and palpitations.  Gastrointestinal: Positive for abdominal pain. Negative for blood in stool, constipation, diarrhea and vomiting.  Genitourinary: Negative for dysuria, frequency and hematuria.  Musculoskeletal: Negative for arthralgias, back pain and neck pain.  Skin: Negative.   Neurological: Negative for dizziness, seizures and speech difficulty.  Psychiatric/Behavioral: Negative for confusion and hallucinations.     Physical Exam Updated Vital Signs BP (!) 136/113   Pulse (!) 103   Temp 97.8 F (36.6 C) (Oral)   Resp 16   Ht 5\' 6"  (1.676 m)   Wt (!) 157.4 kg (347 lb)   SpO2 98%   BMI 56.01 kg/m   Physical Exam  Constitutional: He is oriented to person, place, and time. He appears well-developed and well-nourished.  Non-toxic appearance.  HENT:  Head: Normocephalic.  Right Ear: Tympanic membrane and external ear normal.  Left Ear: Tympanic membrane and external ear normal.  Eyes: EOM and lids are normal. Pupils are equal, round, and reactive to light.  Neck: Normal range of motion. Neck supple. Carotid bruit is not present.  Cardiovascular: Normal rate, regular rhythm, normal heart sounds,  intact distal pulses and normal pulses.  Pulmonary/Chest: Breath sounds normal. No respiratory distress.  Abdominal: Soft. Normal appearance and bowel sounds are normal. He exhibits no distension, no pulsatile midline mass and no mass. There is no tenderness. There is no guarding.    Musculoskeletal: Normal range of motion.  Lymphadenopathy:       Head (right side): No submandibular adenopathy present.       Head (left side): No submandibular adenopathy present.    He has no cervical adenopathy.  Neurological: He is alert and  oriented to person, place, and time. He has normal strength. No cranial nerve deficit or sensory deficit.  Skin: Skin is warm and dry.  Psychiatric: He has a normal mood and affect. His speech is normal.  Nursing note and vitals reviewed.    ED Treatments / Results  Labs (all labs ordered are listed, but only abnormal results are displayed) Labs Reviewed  COMPREHENSIVE METABOLIC PANEL  LIPASE, BLOOD  CBC WITH DIFFERENTIAL/PLATELET  URINALYSIS, ROUTINE W REFLEX MICROSCOPIC  TROPONIN I  POC OCCULT BLOOD, ED    EKG  EKG Interpretation None       Radiology No results found.  Procedures Procedures (including critical care time)  Medications Ordered in ED Medications  gi cocktail (Maalox,Lidocaine,Donnatal) (not administered)     Initial Impression / Assessment and Plan / ED Course  I have reviewed the triage vital signs and the nursing notes.  Pertinent labs & imaging results that were available during my care of the patient were reviewed by me and considered in my medical decision making (see chart for details).       Final Clinical Impressions(s) / ED Diagnoses MDM Blood pressure is elevated on admission to the emergency department at 136/113.  Patient has a history of hypertension.  Heart rate is 103.  Patient in no distress at this time, but states he has an occasional pain just between the epigastric area and the periumbilical area.   Labs and urine pending.  Patient will receive a GI cocktail to see if it helps with his discomfort.  Patient seen by Dr. Lenna Sciara.  GI cocktail seems to have helped pain some.  Conference of metabolic panel shows the glucose to be 144, otherwise within normal limits.  The lipase is normal at 33.  The complete blood count shows a white blood cells to be elevated at 12,900.  The hemoglobin is 8 and hematocrit are slightly low.  There is no shift to the left. Stool for occult blood is negative. CT scan shows inflammatory changes most evident of acute pancreatitis.  No other acute changes appreciated.  Case discussed with Dr.Rehman.  Patient to be placed on clear liquids.  He will use Tylenol for mild pain, refrain from use of ibuprofen and related medications.  Prescription for Norco will be given to the patient to use.  The patient will return to the emergency department if any emergent changes, problems, or concerns.  Recheck.  No new changes in examination.  The patient sitting in recliner, conversing with his wife in no distress.  Lungs remain clear.  Heart regular rate and rhythm with no tachycardia.  Abdomen is soft with good bowel sounds, nontender.   Final diagnoses:  Idiopathic acute pancreatitis without infection or necrosis    ED Discharge Orders    None       Lily Kocher, PA-C 01/09/17 [place], MD 01/09/17 [phone]

## 2017-01-09 NOTE — ED Notes (Signed)
Pt returned from CT °

## 2017-01-09 NOTE — ED Triage Notes (Signed)
Abdominal pain since Friday Morning. .  Decreased appetite.   Pt c/o dark stools, but has been taking pepto-bismol

## 2017-01-09 NOTE — Telephone Encounter (Signed)
Patient Name: George Christensen DOB: 05-Feb-1951 Initial Comment Caller states they've abdominal pain. Nurse Assessment Nurse: [name], RN, [name] Date/Time [name] Time): 01/09/2017 8:19:43 AM Confirm and document reason for call. If symptomatic, describe symptoms. ---Caller states he's had abdominal pain all weekend, right below the rib cage. Pain 3/10 but throughout the day gets worse and goes up to 6-7/10. No appetite, says when he does eat it exacerbates the pain. Says in the past he's had chest pain and had a stress test in October that came back normal. Not currently having chest pain. Having blood in the stool that started Saturday and had some again this morning. Does the patient have any new or worsening symptoms? ---Yes Will a triage be completed? ---Yes Related visit to physician within the last 2 weeks? ---No Does the PT have any chronic conditions? (i.e. diabetes, asthma, etc.) ---Yes List chronic conditions. ---HTN, Diabetes Is this a behavioral health or substance abuse call? ---No Guidelines Guideline Title Affirmed Question Affirmed Notes Abdominal Pain - Upper [1] Pain lasts > 10 minutes AND [2] age > 67 Final Disposition User Go to ED Now [name], RN, [facility] - ED Caller Disagree/Comply Comply Caller Understands Yes PreDisposition Go to ED

## 2017-01-09 NOTE — Telephone Encounter (Signed)
FYI

## 2017-01-09 NOTE — ED Notes (Signed)
Went to get d/c vital signs, pt refused.  States that bp cuff hurts too much, EDPA in room at time and witnessed pt's refusal for d/c vital signs.

## 2017-01-09 NOTE — Discharge Instructions (Signed)
Your test suggest pancreatitis. Please stay on a liquid diet until seen by Dr Laural Golden. Use pepcid and nexium daily. Use tylenol for mild pain. Use norco for more severe pain.This medication may cause drowsiness. Please do not drink, drive, or participate in activity that requires concentration while taking this medication.

## 2017-01-09 NOTE — ED Provider Notes (Signed)
Complains of hypogastric pain onset 4 days ago, constant, not affected by eating though he does admit to diminished appetite.  No nausea or vomiting no shortness of breath pain is not affected by walking he denies any chest pain.  Pain improved since treatment with GI cocktail here on exam no distress.  Heart regular rate and rhythm lungs clear to auscultation abdomen obese, nontender.   Orlie Dakin, MD 01/09/17 [phone]

## 2017-01-11 ENCOUNTER — Ambulatory Visit (INDEPENDENT_AMBULATORY_CARE_PROVIDER_SITE_OTHER): Payer: Medicare HMO | Admitting: Internal Medicine

## 2017-01-11 ENCOUNTER — Encounter (INDEPENDENT_AMBULATORY_CARE_PROVIDER_SITE_OTHER): Payer: Self-pay | Admitting: Internal Medicine

## 2017-01-11 VITALS — BP 118/68 | HR 76 | Temp 97.5°F | Ht 66.0 in | Wt 329.0 lb

## 2017-01-11 DIAGNOSIS — K8500 Idiopathic acute pancreatitis without necrosis or infection: Secondary | ICD-10-CM | POA: Diagnosis not present

## 2017-01-11 DIAGNOSIS — K859 Acute pancreatitis without necrosis or infection, unspecified: Secondary | ICD-10-CM

## 2017-01-11 HISTORY — DX: Acute pancreatitis without necrosis or infection, unspecified: K85.90

## 2017-01-11 LAB — CBC WITH DIFFERENTIAL/PLATELET
BASOS PCT: 0.7 %
Basophils Absolute: 60 cells/uL (ref 0–200)
EOS ABS: 332 {cells}/uL (ref 15–500)
Eosinophils Relative: 3.9 %
HEMATOCRIT: 40.1 % (ref 38.5–50.0)
Hemoglobin: 13.2 g/dL (ref 13.2–17.1)
Lymphs Abs: 1700 cells/uL (ref 850–3900)
MCH: 27.5 pg (ref 27.0–33.0)
MCHC: 32.9 g/dL (ref 32.0–36.0)
MCV: 83.5 fL (ref 80.0–100.0)
MPV: 12.2 fL (ref 7.5–12.5)
Monocytes Relative: 6.6 %
Neutro Abs: 5848 cells/uL (ref 1500–7800)
Neutrophils Relative %: 68.8 %
PLATELETS: 311 10*3/uL (ref 140–400)
RBC: 4.8 10*6/uL (ref 4.20–5.80)
RDW: 13 % (ref 11.0–15.0)
TOTAL LYMPHOCYTE: 20 %
WBC: 8.5 10*3/uL (ref 3.8–10.8)
WBCMIX: 561 {cells}/uL (ref 200–950)

## 2017-01-11 LAB — HEPATIC FUNCTION PANEL
AG RATIO: 1.4 (calc) (ref 1.0–2.5)
ALBUMIN MSPROF: 3.9 g/dL (ref 3.6–5.1)
ALT: 17 U/L (ref 9–46)
AST: 27 U/L (ref 10–35)
Alkaline phosphatase (APISO): 105 U/L (ref 40–115)
BILIRUBIN DIRECT: 0.2 mg/dL (ref 0.0–0.2)
BILIRUBIN TOTAL: 0.6 mg/dL (ref 0.2–1.2)
GLOBULIN: 2.8 g/dL (ref 1.9–3.7)
Indirect Bilirubin: 0.4 mg/dL (calc) (ref 0.2–1.2)
Total Protein: 6.7 g/dL (ref 6.1–8.1)

## 2017-01-11 LAB — AMYLASE: Amylase: 30 U/L (ref 21–101)

## 2017-01-11 NOTE — Addendum Note (Signed)
Addended by: Butch Penny on: 01/11/2017 10:40 AM   Modules accepted: Orders

## 2017-01-11 NOTE — Patient Instructions (Addendum)
Acute Pancreatitis Acute pancreatitis is a condition in which the pancreas suddenly gets irritated and swollen (has inflammation). The pancreas is a large gland behind the stomach. It makes enzymes that help to digest food. The pancreas also makes hormones that help to control your blood sugar. Acute pancreatitis happens when the enzymes attack the pancreas and damage it. Most attacks last a couple of days and can cause serious problems. Follow these instructions at home: Eating and drinking  Follow instructions from your doctor about diet. You may need to: ? Avoid alcohol. ? Limit how much fat is in your diet.  Eat small meals often. Avoid eating big meals.  Drink enough fluid to keep your pee (urine) clear or pale yellow.  Do not drink alcohol if it caused your condition. General instructions  Take over-the-counter and prescription medicines only as told by your doctor.  Do not use any tobacco products. These include cigarettes, chewing tobacco, and e-cigarettes. If you need help quitting, ask your doctor.  Get plenty of rest.  If directed, check your blood sugar at home as told by your doctor.  Keep all follow-up visits as told by your doctor. This is important. Contact a doctor if:  You do not get better as quickly as expected.  You have new symptoms.  Your symptoms get worse.  You have lasting pain or weakness.  You continue to feel sick to your stomach (nauseous).  You get better and then you have another pain attack.  You have a fever. Get help right away if:  You cannot eat or keep fluids down.  Your pain becomes very bad.  Your skin or the white part of your eyes turns yellow (jaundice).  You throw up (vomit).  You feel dizzy or you pass out (faint).  Your blood sugar is high (over 300 mg/dL). This information is not intended to replace advice given to you by your health care provider. Make sure you discuss any questions you have with your health care  provider. Document Released: 08/03/2007 Document Revised: 07/23/2015 Document Reviewed: 11/18/2014 Elsevier Interactive Patient Education  2018 [employer].  Acute Pancreatitis Acute pancreatitis is a condition in which the pancreas suddenly becomes irritated and swollen (has inflammation). The pancreas is a gland that is located behind the stomach. It produces enzymes that help to digest food. The pancreas also releases the hormones glucagon and insulin, which help to regulate blood sugar. Damage to the pancreas occurs when the digestive enzymes from the pancreas are activated before they are released into the intestine. Most acute attacks last a couple of days and can cause serious problems. Some people become dehydrated and develop low blood pressure. In severe cases, bleeding into the pancreas can lead to shock and can be life-threatening. The lungs, heart, and kidneys may fail. What are the causes? The most common causes of this condition are:  Alcohol abuse.  Gallstones.  Other causes include:  Certain medicines.  Exposure to certain chemicals.  Infection.  Damage caused by an accident (trauma).  Abdominal surgery.  In some cases, the cause may not be known. What are the signs or symptoms? Symptoms of this condition include:  Pain in the upper abdomen that may radiate to the back.  Tenderness and swelling of the abdomen.  Nausea and vomiting.  How is this diagnosed? This condition may be diagnosed based on:  A physical exam.  Blood tests.  Imaging tests, such as X-rays, CT scans, or an ultrasound of the abdomen.  How  is this treated? Treatment for this condition usually requires a stay in the hospital. Treatment may include:  Pain medicine.  Fluid replacement through an IV tube.  Placing a tube in the stomach to remove stomach contents and to control vomiting (NG tube, or nasogastric tube).  Not eating for 3-4 days. This gives the pancreas a rest, because  enzymes are not being produced that can cause further damage.  Antibiotic medicines, if your condition is caused by an infection.  Surgery on the pancreas or gallbladder.  Follow these instructions at home: Eating and drinking  Follow instructions from your health care provider about diet. This may involve avoiding alcohol and decreasing the amount of fat in your diet.  Eat smaller, more frequent meals. This reduces the amount of digestive fluids that the pancreas produces.  Drink enough fluid to keep your urine clear or pale yellow.  Do not drink alcohol if it caused your condition. General instructions  Take over-the-counter and prescription medicines only as told by your health care provider.  Do not use any tobacco products, such as cigarettes, chewing tobacco, and e-cigarettes. If you need help quitting, ask your health care provider.  Get plenty of rest.  If directed, check your blood sugar at home as told by your health care provider.  Keep all follow-up visits as told by your health care provider. This is important. Contact a health care provider if:  You do not recover as quickly as expected.  You develop new or worsening symptoms.  You have persistent pain, weakness, or nausea.  You recover and then have another episode of pain.  You have a fever. Get help right away if:  You cannot eat or keep fluids down.  Your pain becomes severe.  Your skin or the white part of your eyes turns yellow (jaundice).  You vomit.  You feel dizzy or you faint.  Your blood sugar is high (over 300 mg/dL). This information is not intended to replace advice given to you by your health care provider. Make sure you discuss any questions you have with your health care provider. Labs today. CT abdomen pelvis in 4 weeks.     Document Released: 02/14/2005 Document Revised: 06/24/2015 Document Reviewed: 11/18/2014 Elsevier Interactive Patient Education  [employer].

## 2017-01-11 NOTE — Progress Notes (Signed)
Subjective:    Patient ID: George Christensen, male    DOB: 1950/06/03, 66 y.o.   MRN: [id number]  HPI Referred by the ED at [facility] for pancreatitis. Seen in the ED 01/09/2017 with hypogastric pain x 4 days. States he ate fish lastThursday night. Woke up Friday morning with abdominal pain. Thought he had food poisoning.   The pain was constant and not affected by eating.  There was no N or V. No fever.  CT scan revealed:  1. Inflammatory changes most evident adjacent to the head and proximal body of the pancreas concerning for an acute pancreatitis. No definite findings of pancreatic necrosis and no definite pancreatic pseudocysts noted at this time. Denies prior hx of pancreatitis. He says he feels wonderful today. He denies any abdominal pain. No nausea or vomiting.  BMs are normal.  Has been on a clear liquid diet since Monday. Has been on a clear liquid diet.    Hx of Diabetes type 2, hypertension, high cholesterol, morbid obesity.  No abdominal pain.  CMP     Component Value Date/Time   NA 138 01/09/2017 1108   NA 143 04/20/2016   K 3.6 01/09/2017 1108   CL 100 (L) 01/09/2017 1108   CO2 29 01/09/2017 1108   GLUCOSE 144 (H) 01/09/2017 1108   BUN 12 01/09/2017 1108   BUN 13 04/20/2016   CREATININE 0.95 01/09/2017 1108   CALCIUM 9.1 01/09/2017 1108   PROT 7.1 01/09/2017 1108   ALBUMIN 3.5 01/09/2017 1108   AST 18 01/09/2017 1108   ALT 17 01/09/2017 1108   ALKPHOS 98 01/09/2017 1108   BILITOT 0.7 01/09/2017 1108   GFRNONAA >60 01/09/2017 1108   GFRAA >60 01/09/2017 1108   01/09/2017 Lipase 33.  Marland Kitchen CBC    Component Value Date/Time   WBC 12.9 (H) 01/09/2017 1108   RBC 4.49 01/09/2017 1108   HGB 12.8 (L) 01/09/2017 1108   HCT 38.9 (L) 01/09/2017 1108   PLT 212 01/09/2017 1108   MCV 86.6 01/09/2017 1108   MCH 28.5 01/09/2017 1108   MCHC 32.9 01/09/2017 1108   RDW 14.0 01/09/2017 1108   LYMPHSABS 1.6 01/09/2017 1108   MONOABS 0.9 01/09/2017 1108   EOSABS 0.4 01/09/2017  1108   BASOSABS 0.0 01/09/2017 1108     Lipid Panel     Component Value Date/Time   CHOL 114 12/12/2016 1159   TRIG 98.0 12/12/2016 1159   HDL 38.80 (L) 12/12/2016 1159   CHOLHDL 3 12/12/2016 1159   VLDL 19.6 12/12/2016 1159   LDLCALC 56 12/12/2016 1159     Review of Systems Past Medical History:  Diagnosis Date  . Abnormal glucose   . Allergic rhinitis   . Allergy   . Anemia    3 yrs ago  . Cough    occ in am yellow green sputum  . Diabetes mellitus without complication ([place])    type 2  . GERD (gastroesophageal reflux disease)   . H/O dizziness   . History of chicken pox   . History of hiatal hernia   . HTN (hypertension)   . Hyperlipidemia   . Hypothyroidism   . Joint pain    hands  . Obesity   . Sinusitis, acute     Past Surgical History:  Procedure Laterality Date  . CHOLECYSTECTOMY    . colonscopy  2008   with polyp removed    No Known Allergies  Current Outpatient Medications on File Prior to Visit  Medication  Sig Dispense Refill  . amLODipine (NORVASC) 5 MG tablet Take 1 tablet (5 mg total) by mouth daily. 90 tablet 1  . aspirin 81 MG tablet Take 81 mg by mouth daily.    . cetirizine (ZYRTEC) 10 MG tablet Take 10 mg by mouth daily.    . famotidine (PEPCID) 20 MG tablet Take 1 tablet (20 mg total) 2 (two) times daily by mouth. 30 tablet 0  . Flaxseed, Linseed, (FLAX SEED OIL) 1300 MG CAPS Take 1,300 mg by mouth daily.     Marland Kitchen glipiZIDE (GLUCOTROL XL) 5 MG 24 hr tablet Take 1 tablet (5 mg total) by mouth daily with breakfast. 90 tablet 1  . HYDROcodone-acetaminophen (NORCO/VICODIN) 5-325 MG tablet Take 1 tablet every 4 (four) hours as needed by mouth. 15 tablet 0  . levothyroxine (SYNTHROID, LEVOTHROID) 50 MCG tablet Take 1 tablet (50 mcg total) by mouth daily. 90 tablet 1  . lisinopril-hydrochlorothiazide (PRINZIDE,ZESTORETIC) 20-12.5 MG tablet Take 1 tablet by mouth daily. 90 tablet 1  . pantoprazole (PROTONIX) 40 MG tablet Take 1 tablet (40 mg  total) by mouth daily. 30 tablet 3  . rosuvastatin (CRESTOR) 20 MG tablet Take 1 tablet (20 mg total) by mouth daily. 90 tablet 1  . terazosin (HYTRIN) 10 MG capsule Take 1 capsule (10 mg total) by mouth at bedtime. 90 capsule 1   No current facility-administered medications on file prior to visit.         Objective:   Physical Exam Blood pressure 118/68, pulse 76, temperature (!) 97.5 F (36.4 C), height 5\' 6"  (1.676 m), weight (!) 329 lb (149.2 kg). Alert and oriented. Skin warm and dry. Oral mucosa is moist.   . Sclera anicteric, conjunctivae is pink. Thyroid not enlarged. No cervical lymphadenopathy. Lungs clear. Heart regular rate and rhythm.  Abdomen is soft. Bowel sounds are positive. No hepatomegaly. No abdominal masses felt. No tenderness.  No edema to lower extremities.           Assessment & Plan:  Follow up pancreatic protocol in 4 weeks. If pain increases, go to the ED.

## 2017-01-26 ENCOUNTER — Other Ambulatory Visit: Payer: Self-pay | Admitting: Family Medicine

## 2017-02-09 ENCOUNTER — Ambulatory Visit (HOSPITAL_COMMUNITY)
Admission: RE | Admit: 2017-02-09 | Discharge: 2017-02-09 | Disposition: A | Discharge status: Home / Self Care | Payer: Medicare HMO | Source: Ambulatory Visit | Attending: Internal Medicine | Admitting: Internal Medicine

## 2017-02-09 ENCOUNTER — Other Ambulatory Visit: Payer: Self-pay | Admitting: Family Medicine

## 2017-02-09 DIAGNOSIS — K869 Disease of pancreas, unspecified: Secondary | ICD-10-CM | POA: Diagnosis not present

## 2017-02-09 DIAGNOSIS — K8500 Idiopathic acute pancreatitis without necrosis or infection: Secondary | ICD-10-CM | POA: Diagnosis present

## 2017-02-09 DIAGNOSIS — K859 Acute pancreatitis without necrosis or infection, unspecified: Secondary | ICD-10-CM | POA: Diagnosis not present

## 2017-02-09 DIAGNOSIS — I700 Atherosclerosis of aorta: Secondary | ICD-10-CM | POA: Diagnosis not present

## 2017-02-09 MED ORDER — IOPAMIDOL (ISOVUE-300) INJECTION 61%
100.0000 mL | Freq: Once | INTRAVENOUS | Status: AC | PRN
  Administered 2017-02-09: 100 mL via INTRAVENOUS

## 2017-03-08 ENCOUNTER — Encounter (INDEPENDENT_AMBULATORY_CARE_PROVIDER_SITE_OTHER): Payer: Self-pay | Admitting: Internal Medicine

## 2017-03-08 ENCOUNTER — Ambulatory Visit (INDEPENDENT_AMBULATORY_CARE_PROVIDER_SITE_OTHER): Payer: Medicare HMO | Admitting: Internal Medicine

## 2017-03-08 VITALS — BP 108/84 | HR 74 | Temp 97.6°F | Ht 67.0 in | Wt 342.2 lb

## 2017-03-08 DIAGNOSIS — K8500 Idiopathic acute pancreatitis without necrosis or infection: Secondary | ICD-10-CM | POA: Diagnosis not present

## 2017-03-08 LAB — HEPATIC FUNCTION PANEL
AG RATIO: 1.6 (calc) (ref 1.0–2.5)
ALBUMIN MSPROF: 4 g/dL (ref 3.6–5.1)
ALT: 26 U/L (ref 9–46)
AST: 34 U/L (ref 10–35)
Alkaline phosphatase (APISO): 96 U/L (ref 40–115)
BILIRUBIN TOTAL: 0.6 mg/dL (ref 0.2–1.2)
Bilirubin, Direct: 0.1 mg/dL (ref 0.0–0.2)
Globulin: 2.5 g/dL (calc) (ref 1.9–3.7)
Indirect Bilirubin: 0.5 mg/dL (calc) (ref 0.2–1.2)
TOTAL PROTEIN: 6.5 g/dL (ref 6.1–8.1)

## 2017-03-08 LAB — CBC WITH DIFFERENTIAL/PLATELET
BASOS ABS: 47 {cells}/uL (ref 0–200)
BASOS PCT: 0.6 %
EOS PCT: 3 %
Eosinophils Absolute: 234 cells/uL (ref 15–500)
HEMATOCRIT: 41.2 % (ref 38.5–50.0)
HEMOGLOBIN: 13.6 g/dL (ref 13.2–17.1)
LYMPHS ABS: 1888 {cells}/uL (ref 850–3900)
MCH: 27.9 pg (ref 27.0–33.0)
MCHC: 33 g/dL (ref 32.0–36.0)
MCV: 84.4 fL (ref 80.0–100.0)
MONOS PCT: 5.7 %
MPV: 11.8 fL (ref 7.5–12.5)
Neutro Abs: 5187 cells/uL (ref 1500–7800)
Neutrophils Relative %: 66.5 %
Platelets: 243 10*3/uL (ref 140–400)
RBC: 4.88 10*6/uL (ref 4.20–5.80)
RDW: 13 % (ref 11.0–15.0)
Total Lymphocyte: 24.2 %
WBC mixed population: 445 cells/uL (ref 200–950)
WBC: 7.8 10*3/uL (ref 3.8–10.8)

## 2017-03-08 NOTE — Patient Instructions (Signed)
Labs today. OV in 1 y ear.  

## 2017-03-08 NOTE — Progress Notes (Signed)
Subjective:    Patient ID: George Christensen, male    DOB: 03-04-1950, 67 y.o.   MRN: [id number]  HPI Here today for f/u. Last seen 8 weeks as an ER f/u for pancreatitis.  Seen in ED 01/09/2017 with hypogastric pain.  Underwent a CT which revealed which revealed: CT scan revealed:  1. Inflammatory changes most evident adjacent to the head and proximal body of the pancreas concerning for an acute pancreatitis. No definite findings of pancreatic necrosis and no definite pancreatic pseudocysts noted at this time. No prior hx of pancreatitis. He felt great at [place] in November. He had been on a clear liquid diet.  Follow up CT in December revealed: IMPRESSION: 1. Significant improvement in the peripancreatic stranding which was especially notable along the pancreatic body and head previously. Subtle residual effacement of the fatty stippling along the pancreatic head may indicate some minimal residual inflammation versus postinflammatory scarring. I do not appreciate a definite underlying mass. He tells me he is doing good. No abdominal pain. BMs are normal.     Known diabetic x 1 year 10/26/2016 HA1C 7.3  Review of Systems   Past Medical History:  Diagnosis Date  . Abnormal glucose   . Allergic rhinitis   . Allergy   . Anemia    3 yrs ago  . Cough    occ in am yellow green sputum  . Diabetes mellitus without complication ([place])    type 2  . GERD (gastroesophageal reflux disease)   . H/O dizziness   . History of chicken pox   . History of hiatal hernia   . HTN (hypertension)   . Hyperlipidemia   . Hypothyroidism   . Joint pain    hands  . Obesity   . Pancreatitis, acute 01/11/2017  . Sinusitis, acute     Past Surgical History:  Procedure Laterality Date  . CHOLECYSTECTOMY    . colonscopy  2008   with polyp removed    No Known Allergies  Current Outpatient Medications on File Prior to Visit  Medication Sig Dispense Refill  . amLODipine (NORVASC) 5 MG tablet TAKE 1  TABLET DAILY 90 tablet 1  . aspirin 81 MG tablet Take 81 mg by mouth daily.    . cetirizine (ZYRTEC) 10 MG tablet Take 10 mg by mouth daily.    . famotidine (PEPCID) 20 MG tablet Take 1 tablet (20 mg total) 2 (two) times daily by mouth. 30 tablet 0  . Flaxseed, Linseed, (FLAX SEED OIL) 1300 MG CAPS Take 1,300 mg by mouth daily.     Marland Kitchen glipiZIDE (GLUCOTROL XL) 5 MG 24 hr tablet Take 1 tablet (5 mg total) by mouth daily with breakfast. 90 tablet 1  . levothyroxine (SYNTHROID, LEVOTHROID) 50 MCG tablet Take 1 tablet (50 mcg total) by mouth daily. 90 tablet 1  . lisinopril-hydrochlorothiazide (PRINZIDE,ZESTORETIC) 20-12.5 MG tablet TAKE 1 TABLET DAILY 90 tablet 1  . Multiple Vitamin (MULTIVITAMIN) tablet Take 1 tablet by mouth daily.    . pantoprazole (PROTONIX) 40 MG tablet TAKE 1 TABLET BY MOUTH EVERY DAY 30 tablet 6  . rosuvastatin (CRESTOR) 20 MG tablet TAKE 1 TABLET (20 MG TOTAL)DAILY 90 tablet 1  . terazosin (HYTRIN) 10 MG capsule TAKE 1 CAPSULE AT BEDTIME 90 capsule 1  . HYDROcodone-acetaminophen (NORCO/VICODIN) 5-325 MG tablet Take 1 tablet every 4 (four) hours as needed by mouth. 15 tablet 0   No current facility-administered medications on file prior to visit.  Objective:   Physical Exam Blood pressure 108/84, pulse 74, temperature 97.6 F (36.4 C), height 5\' 7"  (1.702 m), weight (!) 342 lb 3.2 oz (155.2 kg). Alert and oriented. Skin warm and dry. Oral mucosa is moist.   . Sclera anicteric, conjunctivae is pink. Thyroid not enlarged. No cervical lymphadenopathy. Lungs clear. Heart regular rate and rhythm.  Abdomen is soft. Bowel sounds are positive. No hepatomegaly. No abdominal masses felt. No tenderness.  No edema to lower extremities.         Assessment & Plan:  Acute pancreatitis. He is asymptomatic. Will get a CBC and Hepatic function today. OV in 1 year.

## 2017-03-29 ENCOUNTER — Ambulatory Visit (INDEPENDENT_AMBULATORY_CARE_PROVIDER_SITE_OTHER): Payer: Medicare HMO | Admitting: Family Medicine

## 2017-03-29 ENCOUNTER — Encounter: Payer: Self-pay | Admitting: Family Medicine

## 2017-03-29 ENCOUNTER — Other Ambulatory Visit: Payer: Self-pay

## 2017-03-29 VITALS — BP 118/80 | HR 80 | Temp 98.1°F | Resp 16 | Ht 67.0 in | Wt 337.5 lb

## 2017-03-29 DIAGNOSIS — E119 Type 2 diabetes mellitus without complications: Secondary | ICD-10-CM | POA: Diagnosis not present

## 2017-03-29 LAB — BASIC METABOLIC PANEL
BUN: 16 mg/dL (ref 6–23)
CALCIUM: 9.5 mg/dL (ref 8.4–10.5)
CO2: 28 mEq/L (ref 19–32)
Chloride: 103 mEq/L (ref 96–112)
Creatinine, Ser: 0.94 mg/dL (ref 0.40–1.50)
GFR: 85.41 mL/min (ref >60.00)
GLUCOSE: 150 mg/dL — AB (ref 70–99)
POTASSIUM: 4.4 meq/L (ref 3.5–5.1)
SODIUM: 141 meq/L (ref 135–145)

## 2017-03-29 LAB — HEMOGLOBIN A1C: HEMOGLOBIN A1C: 7.1 % — AB (ref 4.6–6.5)

## 2017-03-29 NOTE — Assessment & Plan Note (Signed)
Ongoing issue.  Pt has lost 5 lbs since his last visit.  Applauded his efforts.  Stressed need for additional weight loss to improve his overall health and wellbeing.  Will follow.

## 2017-03-29 NOTE — Progress Notes (Signed)
   Subjective:    Patient ID: George Christensen, male    DOB: 1950-07-12, 67 y.o.   MRN: [id number]  HPI DM- chronic problem.  Pt on Glipizide XL 5mg  daily.  On ACE for renal protection.  UTD on eye exam, foot exam.  Pt has lost 5 lbs since last visit.  Pt had bout of pancreatitis earlier this month.  Currently asymptomatic.  No abd pain, N/V, CP, SOB, HAs, visual changes.  No numbness/tingling of hands/feet.  Denies symptomatic lows.    Declines flu shot  Review of Systems For ROS see HPI     Objective:   Physical Exam  Constitutional: He is oriented to person, place, and time. He appears well-developed and well-nourished. No distress.  Morbidly obese  HENT:  Head: Normocephalic and atraumatic.  Eyes: Conjunctivae and EOM are normal. Pupils are equal, round, and reactive to light.  Neck: Normal range of motion. Neck supple. No thyromegaly present.  Cardiovascular: Normal rate, regular rhythm, normal heart sounds and intact distal pulses.  No murmur heard. Pulmonary/Chest: Effort normal and breath sounds normal. No respiratory distress.  Abdominal: Soft. Bowel sounds are normal. He exhibits no distension.  Musculoskeletal: He exhibits no edema.  Lymphadenopathy:    He has no cervical adenopathy.  Neurological: He is alert and oriented to person, place, and time. No cranial nerve deficit.  Skin: Skin is warm and dry.  Psychiatric: He has a normal mood and affect. His behavior is normal.  Vitals reviewed.         Assessment & Plan:

## 2017-03-29 NOTE — Assessment & Plan Note (Signed)
Chronic problem.  On Glipizide w/ hx of adequate control.  UTD on foot exam, eye exam, and on ACE for renal protection.  Stressed need for healthy diet and regular exercise.  Check labs.  Adjust meds prn

## 2017-03-29 NOTE — Patient Instructions (Signed)
Follow up in 3-4 months to recheck sugar and cholesterol We'll notify you of your lab results and make any changes if needed Continue to work on healthy diet and regular exercise- you can do it!!! Restart your allergy medication Call with any questions or concerns Happy New Year!!!

## 2017-03-30 ENCOUNTER — Encounter: Payer: Self-pay | Admitting: General Practice

## 2017-04-27 ENCOUNTER — Other Ambulatory Visit: Payer: Self-pay | Admitting: Family Medicine

## 2017-05-11 ENCOUNTER — Other Ambulatory Visit: Payer: Self-pay | Admitting: General Practice

## 2017-05-11 MED ORDER — LEVOTHYROXINE SODIUM 50 MCG PO TABS: 50.0000 ug | ORAL_TABLET | Freq: Every day | ORAL | 1 refills | Status: DC

## 2017-07-12 ENCOUNTER — Ambulatory Visit (INDEPENDENT_AMBULATORY_CARE_PROVIDER_SITE_OTHER): Payer: Medicare HMO | Admitting: Family Medicine

## 2017-07-12 ENCOUNTER — Encounter: Payer: Self-pay | Admitting: Family Medicine

## 2017-07-12 ENCOUNTER — Other Ambulatory Visit: Payer: Self-pay

## 2017-07-12 VITALS — BP 128/82 | HR 80 | Temp 98.0°F | Resp 16 | Ht 67.0 in | Wt 355.0 lb

## 2017-07-12 DIAGNOSIS — E119 Type 2 diabetes mellitus without complications: Secondary | ICD-10-CM

## 2017-07-12 DIAGNOSIS — E1169 Type 2 diabetes mellitus with other specified complication: Secondary | ICD-10-CM

## 2017-07-12 DIAGNOSIS — I10 Essential (primary) hypertension: Secondary | ICD-10-CM | POA: Diagnosis not present

## 2017-07-12 DIAGNOSIS — E785 Hyperlipidemia, unspecified: Secondary | ICD-10-CM

## 2017-07-12 LAB — BASIC METABOLIC PANEL
BUN: 15 mg/dL (ref 6–23)
CALCIUM: 9.1 mg/dL (ref 8.4–10.5)
CO2: 29 mEq/L (ref 19–32)
CREATININE: 0.91 mg/dL (ref 0.40–1.50)
Chloride: 101 mEq/L (ref 96–112)
GFR: 88.59 mL/min (ref >60.00)
Glucose, Bld: 169 mg/dL — ABNORMAL HIGH (ref 70–99)
Potassium: 4 mEq/L (ref 3.5–5.1)
Sodium: 139 mEq/L (ref 135–145)

## 2017-07-12 LAB — HEPATIC FUNCTION PANEL
ALT: 22 U/L (ref 0–53)
AST: 23 U/L (ref 0–37)
Albumin: 4 g/dL (ref 3.5–5.2)
Alkaline Phosphatase: 99 U/L (ref 39–117)
BILIRUBIN DIRECT: 0.1 mg/dL (ref 0.0–0.3)
BILIRUBIN TOTAL: 0.4 mg/dL (ref 0.2–1.2)
Total Protein: 6.6 g/dL (ref 6.0–8.3)

## 2017-07-12 LAB — LIPID PANEL
CHOL/HDL RATIO: 3
Cholesterol: 121 mg/dL (ref 0–200)
HDL: 42.1 mg/dL (ref >39.00)
LDL Cholesterol: 64 mg/dL (ref 0–99)
NONHDL: 78.85
Triglycerides: 73 mg/dL (ref 0.0–149.0)
VLDL: 14.6 mg/dL (ref 0.0–40.0)

## 2017-07-12 LAB — CBC WITH DIFFERENTIAL/PLATELET
BASOS PCT: 0.6 % (ref 0.0–3.0)
Basophils Absolute: 0 10*3/uL (ref 0.0–0.1)
EOS ABS: 0.2 10*3/uL (ref 0.0–0.7)
EOS PCT: 2.4 % (ref 0.0–5.0)
HEMATOCRIT: 41.5 % (ref 39.0–52.0)
HEMOGLOBIN: 13.6 g/dL (ref 13.0–17.0)
LYMPHS PCT: 22.1 % (ref 12.0–46.0)
Lymphs Abs: 1.6 10*3/uL (ref 0.7–4.0)
MCHC: 32.8 g/dL (ref 30.0–36.0)
MCV: 84.7 fl (ref 78.0–100.0)
MONOS PCT: 5.2 % (ref 3.0–12.0)
Monocytes Absolute: 0.4 10*3/uL (ref 0.1–1.0)
NEUTROS ABS: 5 10*3/uL (ref 1.4–7.7)
Neutrophils Relative %: 69.7 % (ref 43.0–77.0)
Platelets: 233 10*3/uL (ref 150.0–400.0)
RBC: 4.9 Mil/uL (ref 4.22–5.81)
RDW: 14.1 % (ref 11.5–15.5)
WBC: 7.2 10*3/uL (ref 4.0–10.5)

## 2017-07-12 LAB — HEMOGLOBIN A1C: Hgb A1c MFr Bld: 7.4 % — ABNORMAL HIGH (ref 4.6–6.5)

## 2017-07-12 LAB — TSH: TSH: 3.29 u[IU]/mL (ref 0.35–4.50)

## 2017-07-12 MED ORDER — LISINOPRIL-HYDROCHLOROTHIAZIDE 20-12.5 MG PO TABS: 1.0000 | ORAL_TABLET | Freq: Every day | ORAL | 1 refills | Status: DC

## 2017-07-12 MED ORDER — TERAZOSIN HCL 10 MG PO CAPS: 10.0000 mg | ORAL_CAPSULE | Freq: Every day | ORAL | 1 refills | Status: DC

## 2017-07-12 MED ORDER — ROSUVASTATIN CALCIUM 20 MG PO TABS: ORAL_TABLET | ORAL | 1 refills | Status: DC

## 2017-07-12 MED ORDER — AMLODIPINE BESYLATE 5 MG PO TABS: 5.0000 mg | ORAL_TABLET | Freq: Every day | ORAL | 1 refills | Status: DC

## 2017-07-12 NOTE — Assessment & Plan Note (Signed)
Chronic problem.  Hx of adequate control.  Asymptomatic.  UTD on eye exam- due next month, pt to schedule.  Foot exam done today.  On ACE for renal protection.  Stressed need for healthy diet and regular exercise.  Check labs.  Adjust meds prn

## 2017-07-12 NOTE — Assessment & Plan Note (Signed)
Chronic problem.  Adequate control.  Check labs.  No anticipated med changes.

## 2017-07-12 NOTE — Patient Instructions (Signed)
Follow up in 3-4 months to recheck diabetes We'll notify you of your lab results and make any changes if needed Continue to work on healthy diet and regular exercise- you can do it! Call and schedule your eye exam (due in June) Add Flonase- 2 sprays each nostril daily- to help w/ the allergies Call with any questions or concerns Happy [birth year][employer] Birthday!!!

## 2017-07-12 NOTE — Assessment & Plan Note (Signed)
Deteriorated.  Pt has gained 17 lbs.  Stressed need for healthy diet and regular exercise.  Check labs to risk stratify.

## 2017-07-12 NOTE — Progress Notes (Signed)
   Subjective:    Patient ID: George Christensen, male    DOB: [birth year], 67 y.o.   MRN: [id number]  HPI HTN- chronic problem,  terazosin 10mg  nightly, amlodipine 5mg , Lisinopril HCTZ 20/12.5mg  w/ adequate control.  Denies CP, SOB, HAs, visual changes, edema.  Hyperlipidemia- chronic problem, on Crestor 20mg  daily.  Denies abd pain, N/V, myalgias.  No regular exercise.  DM- chronic problem, on Glipizide 5mg .  On ACE for renal protection.  UTD on eye exam (due next month).  Due for foot exam.  Pt is up 17 lbs from last visit.  BMI is now 55.6.  Denies symptomatic lows.  No numbness/tingling of hands/feet.  Pt declines Prevnar shot   Review of Systems For ROS see HPI     Objective:   Physical Exam  Constitutional: He is oriented to person, place, and time. He appears well-developed and well-nourished. No distress.  obese  HENT:  Head: Normocephalic and atraumatic.  Eyes: Pupils are equal, round, and reactive to light. Conjunctivae and EOM are normal.  Neck: Normal range of motion. Neck supple. No thyromegaly present.  Cardiovascular: Normal rate, regular rhythm, normal heart sounds and intact distal pulses.  No murmur heard. Pulmonary/Chest: Effort normal and breath sounds normal. No respiratory distress.  Abdominal: Soft. Bowel sounds are normal. He exhibits no distension.  Musculoskeletal: He exhibits no edema.  Lymphadenopathy:    He has no cervical adenopathy.  Neurological: He is alert and oriented to person, place, and time. No cranial nerve deficit.  Skin: Skin is warm and dry.  Psychiatric: He has a normal mood and affect. His behavior is normal.  Vitals reviewed.         Assessment & Plan:

## 2017-07-12 NOTE — Assessment & Plan Note (Signed)
Chronic problem.  Tolerating statin w/o difficulty.  Stressed need for healthy diet and regular exercise.  Check labs.  Adjust meds prn  

## 2017-07-13 ENCOUNTER — Other Ambulatory Visit: Payer: Self-pay | Admitting: General Practice

## 2017-07-13 MED ORDER — GLIPIZIDE ER 10 MG PO TB24: 10.0000 mg | ORAL_TABLET | Freq: Every day | ORAL | 1 refills | Status: DC

## 2017-08-03 ENCOUNTER — Telehealth (INDEPENDENT_AMBULATORY_CARE_PROVIDER_SITE_OTHER): Payer: Self-pay | Admitting: Internal Medicine

## 2017-08-03 NOTE — Telephone Encounter (Signed)
Patient called would like to talk to someone about a reoccurring problem - please call [phone]

## 2017-08-04 ENCOUNTER — Telehealth (INDEPENDENT_AMBULATORY_CARE_PROVIDER_SITE_OTHER): Payer: Self-pay | Admitting: *Deleted

## 2017-08-04 ENCOUNTER — Other Ambulatory Visit (INDEPENDENT_AMBULATORY_CARE_PROVIDER_SITE_OTHER): Payer: Self-pay | Admitting: Internal Medicine

## 2017-08-04 ENCOUNTER — Other Ambulatory Visit (INDEPENDENT_AMBULATORY_CARE_PROVIDER_SITE_OTHER): Payer: Self-pay | Admitting: *Deleted

## 2017-08-04 DIAGNOSIS — R6883 Chills (without fever): Secondary | ICD-10-CM

## 2017-08-04 DIAGNOSIS — Z8719 Personal history of other diseases of the digestive system: Secondary | ICD-10-CM

## 2017-08-04 DIAGNOSIS — R109 Unspecified abdominal pain: Secondary | ICD-10-CM | POA: Diagnosis not present

## 2017-08-04 MED ORDER — HYDROCODONE-ACETAMINOPHEN 5-325 MG PO TABS: 1.0000 | ORAL_TABLET | Freq: Four times a day (QID) | ORAL | 0 refills | Status: DC | PRN

## 2017-08-04 NOTE — Addendum Note (Signed)
Addended by: Grayland Ormond on: 08/04/2017 11:14 AM   Modules accepted: Orders

## 2017-08-04 NOTE — Progress Notes (Unsigned)
  Prescription for hydro-codon/acetaminophen 3/25 p.o. every 6 as needed 30 doses sent to patient's pharmacy. Patient called

## 2017-08-04 NOTE — Telephone Encounter (Signed)
Patient is requesting something for discomfort. Forwarded to Dr.Rehman to address.

## 2017-08-04 NOTE — Telephone Encounter (Signed)
Patient states that that 2 days ago he started having the same symptoms that he had with Pancreatitis. Eating a bland diet ,  Same type pain, chills..  Per Dr.Rehman the patient is to go on a clear liquid diet till he hears from the following lab work. CBC, Amylase, Lipase, and Hepatic Profile.  Patient was called and made aware, he is requesting medication that may help him until then. Dr. Laural Golden was called ,waitng to hear from him.regarding the medication.

## 2017-08-04 NOTE — Telephone Encounter (Signed)
Prescription for hydrocodone/acetaminophen 3/325 p.o. every 6 PRN sent to patient's pharmacy.  30 doses.

## 2017-08-04 NOTE — Telephone Encounter (Signed)
Patient was called ,refer to another encounter.

## 2017-08-05 LAB — CBC
HEMATOCRIT: 38.1 % — AB (ref 38.5–50.0)
HEMOGLOBIN: 12.9 g/dL — AB (ref 13.2–17.1)
MCH: 28 pg (ref 27.0–33.0)
MCHC: 33.9 g/dL (ref 32.0–36.0)
MCV: 82.6 fL (ref 80.0–100.0)
MPV: 12.7 fL — ABNORMAL HIGH (ref 7.5–12.5)
Platelets: 214 10*3/uL (ref 140–400)
RBC: 4.61 10*6/uL (ref 4.20–5.80)
RDW: 12.9 % (ref 11.0–15.0)
WBC: 10.6 10*3/uL (ref 3.8–10.8)

## 2017-08-05 LAB — HEPATIC FUNCTION PANEL
AG RATIO: 1.6 (calc) (ref 1.0–2.5)
ALT: 18 U/L (ref 9–46)
AST: 15 U/L (ref 10–35)
Albumin: 4 g/dL (ref 3.6–5.1)
Alkaline phosphatase (APISO): 95 U/L (ref 40–115)
BILIRUBIN DIRECT: 0.2 mg/dL (ref 0.0–0.2)
BILIRUBIN INDIRECT: 0.4 mg/dL (ref 0.2–1.2)
BILIRUBIN TOTAL: 0.6 mg/dL (ref 0.2–1.2)
Globulin: 2.5 g/dL (calc) (ref 1.9–3.7)
TOTAL PROTEIN: 6.5 g/dL (ref 6.1–8.1)

## 2017-08-05 LAB — AMYLASE: AMYLASE: 43 U/L (ref 21–101)

## 2017-08-05 LAB — LIPASE: Lipase: 22 U/L (ref 7–60)

## 2017-08-22 ENCOUNTER — Other Ambulatory Visit: Payer: Self-pay | Admitting: Family Medicine

## 2017-10-17 LAB — HM DIABETES EYE EXAM

## 2017-10-20 DIAGNOSIS — E119 Type 2 diabetes mellitus without complications: Secondary | ICD-10-CM | POA: Diagnosis not present

## 2017-10-20 DIAGNOSIS — H524 Presbyopia: Secondary | ICD-10-CM | POA: Diagnosis not present

## 2017-10-20 DIAGNOSIS — H52223 Regular astigmatism, bilateral: Secondary | ICD-10-CM | POA: Diagnosis not present

## 2017-10-20 DIAGNOSIS — Z7984 Long term (current) use of oral hypoglycemic drugs: Secondary | ICD-10-CM | POA: Diagnosis not present

## 2017-10-20 DIAGNOSIS — H5203 Hypermetropia, bilateral: Secondary | ICD-10-CM | POA: Diagnosis not present

## 2017-10-20 DIAGNOSIS — I10 Essential (primary) hypertension: Secondary | ICD-10-CM | POA: Diagnosis not present

## 2017-10-20 LAB — HM DIABETES EYE EXAM

## 2017-10-31 ENCOUNTER — Other Ambulatory Visit: Payer: Self-pay

## 2017-10-31 ENCOUNTER — Encounter: Payer: Self-pay | Admitting: Family Medicine

## 2017-10-31 ENCOUNTER — Ambulatory Visit (INDEPENDENT_AMBULATORY_CARE_PROVIDER_SITE_OTHER): Payer: Medicare HMO | Admitting: Family Medicine

## 2017-10-31 VITALS — BP 126/81 | HR 78 | Temp 98.1°F | Resp 18 | Ht 67.0 in | Wt 338.4 lb

## 2017-10-31 DIAGNOSIS — E119 Type 2 diabetes mellitus without complications: Secondary | ICD-10-CM

## 2017-10-31 LAB — BASIC METABOLIC PANEL
BUN: 21 mg/dL (ref 6–23)
CHLORIDE: 102 meq/L (ref 96–112)
CO2: 24 meq/L (ref 19–32)
Calcium: 9.6 mg/dL (ref 8.4–10.5)
Creatinine, Ser: 1.12 mg/dL (ref 0.40–1.50)
GFR: 69.65 mL/min (ref >60.00)
Glucose, Bld: 152 mg/dL — ABNORMAL HIGH (ref 70–99)
POTASSIUM: 4.5 meq/L (ref 3.5–5.1)
Sodium: 139 mEq/L (ref 135–145)

## 2017-10-31 LAB — HEMOGLOBIN A1C: Hgb A1c MFr Bld: 7.4 % — ABNORMAL HIGH (ref 4.6–6.5)

## 2017-10-31 NOTE — Assessment & Plan Note (Signed)
Chronic problem.  On Glipizide XL 10mg  daily.  On ACE for renal protection, UTD on foot exam, eye exam.  Stressed need for healthy diet, regular exercise.  Check labs.  Adjust meds prn

## 2017-10-31 NOTE — Progress Notes (Signed)
   Subjective:    Patient ID: George Christensen, male    DOB: 03-10-1950, 67 y.o.   MRN: [id number]  HPI DM- chronic problem, on Glipizide XL 10mg  daily.  On ACE for renal protection.  UTD on foot exam, eye exam.  No CP, SOB, HAs (w/ exception of overheating).  Denies symptomatic lows.  No numbness/tingling of hands/feet.  Obesity- pt's BMI is 53.  He is down 17 lbs since last visit.  He has been working on his farm regularly.  + nausea when overheated- pt reports good water intake.  Pt is looking for 'more professional help' to lose weight.   Review of Systems For ROS see HPI     Objective:   Physical Exam  Constitutional: He is oriented to person, place, and time. He appears well-developed and well-nourished. No distress.  obese  HENT:  Head: Normocephalic and atraumatic.  Eyes: Pupils are equal, round, and reactive to light. Conjunctivae and EOM are normal.  Neck: Normal range of motion. Neck supple. No thyromegaly present.  Cardiovascular: Normal rate, regular rhythm, normal heart sounds and intact distal pulses.  No murmur heard. Pulmonary/Chest: Effort normal and breath sounds normal. No respiratory distress.  Abdominal: Soft. Bowel sounds are normal. He exhibits no distension.  Musculoskeletal: He exhibits no edema.  Lymphadenopathy:    He has no cervical adenopathy.  Neurological: He is alert and oriented to person, place, and time. No cranial nerve deficit.  Skin: Skin is warm and dry.  Psychiatric: He has a normal mood and affect. His behavior is normal.  Vitals reviewed.         Assessment & Plan:

## 2017-10-31 NOTE — Assessment & Plan Note (Signed)
Pt is now interested in a nutrition referral as he is frustrated w/ his continued obesity.  Discussed need for exercise but to do so in moderation to avoid getting overheated in the high heat and humidity of summer.  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  Discussed this at length.  Will continue to follow.

## 2017-10-31 NOTE — Patient Instructions (Signed)
Schedule your complete physical in 3-4 months We'll notify you of your lab results and make any changes if needed Continue to work on healthy diet and regular exercise- you can do it!! We'll call you with your Nutrition appt to work on the weight loss Make sure you are eating regularly throughout the day, take frequent breaks, continue to drink plenty of fluids Call with any questions or concerns Happy Fall!!!

## 2017-11-09 ENCOUNTER — Encounter: Payer: Self-pay | Admitting: General Practice

## 2017-11-12 ENCOUNTER — Other Ambulatory Visit: Payer: Self-pay | Admitting: Family Medicine

## 2017-11-29 ENCOUNTER — Encounter: Payer: Medicare HMO | Attending: Family Medicine | Admitting: Nutrition

## 2017-11-29 ENCOUNTER — Encounter: Payer: Self-pay | Admitting: Nutrition

## 2017-11-29 DIAGNOSIS — Z713 Dietary counseling and surveillance: Secondary | ICD-10-CM | POA: Diagnosis not present

## 2017-11-29 DIAGNOSIS — E119 Type 2 diabetes mellitus without complications: Secondary | ICD-10-CM | POA: Insufficient documentation

## 2017-11-29 DIAGNOSIS — Z6841 Body Mass Index (BMI) 40.0 and over, adult: Secondary | ICD-10-CM | POA: Insufficient documentation

## 2017-11-29 DIAGNOSIS — E785 Hyperlipidemia, unspecified: Secondary | ICD-10-CM | POA: Diagnosis not present

## 2017-11-29 DIAGNOSIS — E1165 Type 2 diabetes mellitus with hyperglycemia: Secondary | ICD-10-CM

## 2017-11-29 DIAGNOSIS — E118 Type 2 diabetes mellitus with unspecified complications: Secondary | ICD-10-CM

## 2017-11-29 DIAGNOSIS — IMO0002 Reserved for concepts with insufficient information to code with codable children: Secondary | ICD-10-CM

## 2017-11-29 NOTE — Progress Notes (Signed)
  Medical Nutrition Therapy:  Appt start time: 1100 end time:  1300.   Assessment:  Primary concerns today: DIabetes Type 2, Obesity, Hyperlipidemia. Lives with his wife. Eats mostly away from home. Eats 2 meals per day. Retired. Doesn't cook a lot at home. A1C 7.5%. On GLipizide. Hasn't been testing blood sugars. Doesn't have a meter. He has never met with a CDE/RDN before. Wants to lose weight and improve his DM and get off his medications. 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 Lost both is parents this past summer. Cared for his parents. Denies depression. Engaged and willing to make changes with lifestyle to lose weight and improve DM.  Vitals with BMI 10/31/2017  Height _0   Weight 338 lbs 6 oz  BMI 94.49  Systolic 675  Diastolic 81  Pulse 78  Respirations 18   CMP Latest Ref Rng & Units 10/31/2017 08/04/2017 07/12/2017  Glucose 70 - 99 mg/dL 152(H) - 169(H)  BUN 6 - 23 mg/dL 21 - 15  Creatinine 0.40 - 1.50 mg/dL 1.12 - 0.91  Sodium 135 - 145 mEq/L 139 - 139  Potassium 3.5 - 5.1 mEq/L 4.5 - 4.0  Chloride 96 - 112 mEq/L 102 - 101  CO2 19 - 32 mEq/L 24 - 29  Calcium 8.4 - 10.5 mg/dL 9.6 - 9.1  Total Protein 6.1 - 8.1 g/dL - 6.5 6.6  Total Bilirubin 0.2 - 1.2 mg/dL - 0.6 0.4  Alkaline Phos 39 - 117 U/L - - 99  AST 10 - 35 U/L - 15 23  ALT 9 - 46 U/L - 18 22   Lab Results  Component Value Date   HGBA1C 7.4 (H) 10/31/2017   Lipid Panel     Component Value Date/Time   CHOL 121 07/12/2017 0838   TRIG 73.0 07/12/2017 0838   HDL 42.10 07/12/2017 0838   CHOLHDL 3 07/12/2017 0838   VLDL 14.6 07/12/2017 0838   LDLCALC 64 07/12/2017 0838     Preferred Learning Style:  No preference indicated   Learning Readiness:  Ready  Change in progress   MEDICATIONS:    DIETARY INTAKE:    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   Nutritional Diagnosis:   NB-1.1 Food and nutrition-related knowledge deficit As related to Diabetes.  As evidenced by A1C 7.4%.    Intervention:  Nutrition and Diabetes education provided on My Plate, CHO counting, meal planning, portion sizes, timing of meals, avoiding snacks between meals unless having a low blood sugar, target ranges for A1C and blood sugars, signs/symptoms and treatment of hyper/hypoglycemia, monitoring blood sugars, taking medications as prescribed, benefits of exercising 30 minutes per day and prevention of complications of DM. Marland Kitchen Goals 1. Follow MY Plate 2. Watch portion sizes 3. Eat meals on time 4. Eat meal on time 5. CUt out tea and drink water only. Test blood sugars twice a day Ask MD about retrying Metformin and stopping Glipizide for better blood sugar control.   Teaching Method Utilized:  Visual Auditory Hands on  Handouts given during visit include:  The Plate Method   Meal Plan Card  Diabetes Instructions.   Barriers to learning/adherence to lifestyle change: none  Demonstrated degree of understanding via:  Teach Back   Monitoring/Evaluation:  Dietary intake, exercise, meal planning, and body weight in 1 month(s).

## 2017-11-29 NOTE — Patient Instructions (Addendum)
Goals 1. Follow MY Plate 2. Watch portion sizes 3. Eat meals on time 4. Eat meal on time 5. CUt out tea and drink water only. Test blood sugars twice a day Ask MD about retrying Metformin and stopping Glipizide for better blood sugar control.

## 2017-12-04 ENCOUNTER — Telehealth: Payer: Self-pay | Admitting: General Practice

## 2017-12-04 MED ORDER — GLUCOSE BLOOD VI STRP: ORAL_STRIP | 12 refills | Status: DC

## 2017-12-04 MED ORDER — ONETOUCH ULTRASOFT LANCETS MISC: 12 refills | Status: DC

## 2017-12-04 NOTE — Telephone Encounter (Signed)
Medication filled to pharmacy as requested.   

## 2017-12-04 NOTE — Addendum Note (Signed)
Addended by: Desmond Dike L on: 12/04/2017 11:26 AM   Modules accepted: Orders

## 2017-12-04 NOTE — Telephone Encounter (Signed)
Ok for script for Lancets and test strips.  He should test twice daily- once fasting and once 2 hrs after eating.

## 2017-12-04 NOTE — Telephone Encounter (Signed)
Please advise, I do not know who may crumpton is   Copied from [place] [phone]. Topic: General - Other >> Dec 04, 2017  9:45 AM Carolyn Stare wrote:  Pt saw May Crumpton who gave him a one touch verio and he was told he had to have his PCP write a RX for lance ts and test strips    Pharmacy [facility]

## 2017-12-06 DIAGNOSIS — R69 Illness, unspecified: Secondary | ICD-10-CM | POA: Diagnosis not present

## 2018-01-01 ENCOUNTER — Other Ambulatory Visit: Payer: Self-pay | Admitting: Family Medicine

## 2018-01-04 DIAGNOSIS — R69 Illness, unspecified: Secondary | ICD-10-CM | POA: Diagnosis not present

## 2018-01-13 ENCOUNTER — Other Ambulatory Visit: Payer: Self-pay | Admitting: Family Medicine

## 2018-01-17 ENCOUNTER — Encounter: Payer: Medicare HMO | Attending: Family Medicine | Admitting: Nutrition

## 2018-01-17 VITALS — Ht 67.0 in | Wt 341.0 lb

## 2018-01-17 DIAGNOSIS — Z6841 Body Mass Index (BMI) 40.0 and over, adult: Secondary | ICD-10-CM | POA: Insufficient documentation

## 2018-01-17 DIAGNOSIS — Z713 Dietary counseling and surveillance: Secondary | ICD-10-CM | POA: Insufficient documentation

## 2018-01-17 DIAGNOSIS — E118 Type 2 diabetes mellitus with unspecified complications: Secondary | ICD-10-CM

## 2018-01-17 DIAGNOSIS — E1165 Type 2 diabetes mellitus with hyperglycemia: Secondary | ICD-10-CM

## 2018-01-17 DIAGNOSIS — IMO0002 Reserved for concepts with insufficient information to code with codable children: Secondary | ICD-10-CM

## 2018-01-17 NOTE — Patient Instructions (Signed)
Goals 1. Increase lower carb choices 2. Increase walking 30 minutes a day 3. Talk MD about Metformin

## 2018-01-17 NOTE — Progress Notes (Signed)
Medical Nutrition Therapy:  Appt start time: 0800 end time:  0900    Assessment:  Primary concerns today: DIabetes Type 2, Obesity, Hyperlipidemia. Lives with his wife. Eats mostly away from home. Eats 2 meals per day. Retired. Doesn't cook a lot at home. A1C 7.5%. Feels hungry often. Skips lunch sometimes. Eats late at night. Working on smaller portions and getting foods more baked and broiled.  Still on Glipizide. Has had shaky feeling at times and has to eat something.  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 He notes his BS in am 160-180's in am when he checks. Tried Metformin in past with GI upset but was taking on an empty stomach. He is willing to talk to MD about retying Metformin and stop GLipizide due to weight gain, increased hunger and low blood sugar symptoms at times. Metformin would help with FBS . Gained 3 lbs with winter clothes on but down from 355 lbs in May til now.  Vitals with BMI 10/31/2017  Height 5\' 7"   Weight 338 lbs 6 oz  BMI 41.96  Systolic 222  Diastolic 81  Pulse 78  Respirations 18   CMP Latest Ref Rng & Units 10/31/2017 08/04/2017 07/12/2017  Glucose 70 - 99 mg/dL 152(H) - 169(H)  BUN 6 - 23 mg/dL 21 - 15  Creatinine 0.40 - 1.50 mg/dL 1.12 - 0.91  Sodium 135 - 145 mEq/L 139 - 139  Potassium 3.5 - 5.1 mEq/L 4.5 - 4.0  Chloride 96 - 112 mEq/L 102 - 101  CO2 19 - 32 mEq/L 24 - 29  Calcium 8.4 - 10.5 mg/dL 9.6 - 9.1  Total Protein 6.1 - 8.1 g/dL - 6.5 6.6  Total Bilirubin 0.2 - 1.2 mg/dL - 0.6 0.4  Alkaline Phos 39 - 117 U/L - - 99  AST 10 - 35 U/L - 15 23  ALT 9 - 46 U/L - 18 22   Lab Results  Component Value Date   HGBA1C 7.4 (H) 10/31/2017   Lipid Panel     Component Value Date/Time   CHOL 121 07/12/2017 0838   TRIG 73.0 07/12/2017 0838   HDL 42.10 07/12/2017 0838   CHOLHDL 3 07/12/2017 0838   VLDL 14.6 07/12/2017 0838   LDLCALC 64 07/12/2017 0838   B) cereala  Preferred Learning Style:  No preference indicated    Learning Readiness:  Ready  Change in progress   MEDICATIONS:    DIETARY INTAKE:    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   Nutritional Diagnosis:  NB-1.1 Food and nutrition-related knowledge deficit As related to Diabetes.  As evidenced by A1C 7.4%.    Intervention:  Nutrition and Diabetes education provided on My Plate, CHO counting, meal planning, portion sizes, timing of meals, avoiding snacks between meals unless having a low blood sugar, target ranges for A1C and blood sugars, signs/symptoms and treatment of hyper/hypoglycemia, monitoring blood sugars, taking medications as prescribed, benefits of exercising 30 minutes per day and prevention of complications of DM. Marland Kitchen Goals 1. Increase lower carb choices 2. Increase walking 30 minutes a day 3. Talk MD about Metformin  Teaching Method Utilized:  Visual Auditory Hands on  Handouts given during visit include:  The Plate Method   Meal Plan Card  Diabetes Instructions.   Barriers to learning/adherence to lifestyle change: none  Demonstrated degree  of understanding via:  Teach Back   Monitoring/Evaluation:  Dietary intake, exercise, meal planning, and body weight in 1 month(s). Talk to MD about retrying a long acting Metformin instead of Glipizide. He may also benefit from [place] for needed weight loss and improved BS control.

## 2018-01-23 ENCOUNTER — Encounter: Payer: Self-pay | Admitting: Nutrition

## 2018-02-03 ENCOUNTER — Other Ambulatory Visit: Payer: Self-pay | Admitting: Family Medicine

## 2018-02-04 DIAGNOSIS — R69 Illness, unspecified: Secondary | ICD-10-CM | POA: Diagnosis not present

## 2018-02-05 ENCOUNTER — Other Ambulatory Visit: Payer: Self-pay | Admitting: Family Medicine

## 2018-03-07 DIAGNOSIS — R69 Illness, unspecified: Secondary | ICD-10-CM | POA: Diagnosis not present

## 2018-03-08 ENCOUNTER — Ambulatory Visit (INDEPENDENT_AMBULATORY_CARE_PROVIDER_SITE_OTHER): Payer: Medicare HMO | Admitting: Internal Medicine

## 2018-03-12 ENCOUNTER — Ambulatory Visit (INDEPENDENT_AMBULATORY_CARE_PROVIDER_SITE_OTHER): Payer: Medicare HMO | Admitting: Family Medicine

## 2018-03-12 ENCOUNTER — Encounter: Payer: Self-pay | Admitting: Family Medicine

## 2018-03-12 ENCOUNTER — Other Ambulatory Visit: Payer: Self-pay

## 2018-03-12 VITALS — BP 124/78 | HR 82 | Temp 98.1°F | Resp 16 | Ht 67.0 in | Wt 337.1 lb

## 2018-03-12 DIAGNOSIS — E785 Hyperlipidemia, unspecified: Secondary | ICD-10-CM | POA: Diagnosis not present

## 2018-03-12 DIAGNOSIS — I10 Essential (primary) hypertension: Secondary | ICD-10-CM

## 2018-03-12 DIAGNOSIS — Z125 Encounter for screening for malignant neoplasm of prostate: Secondary | ICD-10-CM

## 2018-03-12 DIAGNOSIS — Z1211 Encounter for screening for malignant neoplasm of colon: Secondary | ICD-10-CM

## 2018-03-12 DIAGNOSIS — Z0000 Encounter for general adult medical examination without abnormal findings: Secondary | ICD-10-CM | POA: Diagnosis not present

## 2018-03-12 DIAGNOSIS — E1169 Type 2 diabetes mellitus with other specified complication: Secondary | ICD-10-CM

## 2018-03-12 LAB — CBC WITH DIFFERENTIAL/PLATELET
Basophils Absolute: 0 10*3/uL (ref 0.0–0.1)
Basophils Relative: 0.5 % (ref 0.0–3.0)
EOS ABS: 0.1 10*3/uL (ref 0.0–0.7)
Eosinophils Relative: 1.5 % (ref 0.0–5.0)
HCT: 42.5 % (ref 39.0–52.0)
Hemoglobin: 14.1 g/dL (ref 13.0–17.0)
Lymphocytes Relative: 22.4 % (ref 12.0–46.0)
Lymphs Abs: 2 10*3/uL (ref 0.7–4.0)
MCHC: 33.1 g/dL (ref 30.0–36.0)
MCV: 83.7 fl (ref 78.0–100.0)
Monocytes Absolute: 0.5 10*3/uL (ref 0.1–1.0)
Monocytes Relative: 5.5 % (ref 3.0–12.0)
NEUTROS PCT: 70.1 % (ref 43.0–77.0)
Neutro Abs: 6.2 10*3/uL (ref 1.4–7.7)
Platelets: 253 10*3/uL (ref 150.0–400.0)
RBC: 5.07 Mil/uL (ref 4.22–5.81)
RDW: 13.3 % (ref 11.5–15.5)
WBC: 8.8 10*3/uL (ref 4.0–10.5)

## 2018-03-12 LAB — BASIC METABOLIC PANEL
BUN: 20 mg/dL (ref 6–23)
CHLORIDE: 101 meq/L (ref 96–112)
CO2: 27 meq/L (ref 19–32)
CREATININE: 0.94 mg/dL (ref 0.40–1.50)
Calcium: 9.9 mg/dL (ref 8.4–10.5)
GFR: 85.16 mL/min (ref >60.00)
Glucose, Bld: 100 mg/dL — ABNORMAL HIGH (ref 70–99)
Potassium: 4 mEq/L (ref 3.5–5.1)
SODIUM: 138 meq/L (ref 135–145)

## 2018-03-12 LAB — HEPATIC FUNCTION PANEL
ALK PHOS: 95 U/L (ref 39–117)
ALT: 18 U/L (ref 0–53)
AST: 21 U/L (ref 0–37)
Albumin: 4.4 g/dL (ref 3.5–5.2)
Bilirubin, Direct: 0.1 mg/dL (ref 0.0–0.3)
Total Bilirubin: 0.5 mg/dL (ref 0.2–1.2)
Total Protein: 7 g/dL (ref 6.0–8.3)

## 2018-03-12 LAB — LIPID PANEL
CHOL/HDL RATIO: 3
Cholesterol: 128 mg/dL (ref 0–200)
HDL: 44 mg/dL (ref >39.00)
LDL Cholesterol: 68 mg/dL (ref 0–99)
NONHDL: 83.82
Triglycerides: 78 mg/dL (ref 0.0–149.0)
VLDL: 15.6 mg/dL (ref 0.0–40.0)

## 2018-03-12 LAB — PSA, MEDICARE: PSA: 1.78 ng/ml (ref 0.10–4.00)

## 2018-03-12 LAB — HEMOGLOBIN A1C: HEMOGLOBIN A1C: 6.7 % — AB (ref 4.6–6.5)

## 2018-03-12 LAB — TSH: TSH: 2.69 u[IU]/mL (ref 0.35–4.50)

## 2018-03-12 NOTE — Assessment & Plan Note (Signed)
Chronic problem.  Well controlled today.  Pt would like to stop one of his medications.  Will stop Terazosin and monitor BP.

## 2018-03-12 NOTE — Progress Notes (Signed)
   Subjective:    Patient ID: George Christensen, male    DOB: 10-25-1950, 68 y.o.   MRN: [id number]  HPI CPE- pt is due for Pneumonia vaccines and flu shot (DECLINES).  Due for colonoscopy (pt sees Dr Benson Norway).    HTN- chronic problem, pt is interested in stopping medication now that he retired and feels 'more relaxed'.  Would like to stop Terazosin.  DM- UTD on foot exam, eye exam, and on ACE for renal protection.  Pt is seeing RD to help w/ diabetes management.   Review of Systems Patient reports no vision/hearing changes, anorexia, fever ,adenopathy, persistant/recurrent hoarseness, swallowing issues, chest pain, palpitations, edema, persistant/recurrent cough, hemoptysis, dyspnea (rest,exertional, paroxysmal nocturnal), gastrointestinal  bleeding (melena, rectal bleeding), abdominal pain, excessive heart burn, GU symptoms (dysuria, hematuria, voiding/incontinence issues) syncope, focal weakness, memory loss, numbness & tingling, skin/hair/nail changes, depression, anxiety, abnormal bruising/bleeding, musculoskeletal symptoms/signs.     Objective:   Physical Exam        Assessment & Plan:

## 2018-03-12 NOTE — Assessment & Plan Note (Signed)
Chronic problem.  Stressed need for healthy diet and regular exercise in addition to medication.  Check labs.  Adjust meds prn

## 2018-03-12 NOTE — Assessment & Plan Note (Signed)
Pt's PE unchanged from previous and WNL w/ exception of obesity.  Declines all immunizations.  Due for colon cancer screen- prefers cologuard.  Check labs.  Anticipatory guidance provided.

## 2018-03-12 NOTE — Assessment & Plan Note (Signed)
Ongoing issue for pt.  BMI is 52.8  Stressed need for healthy diet and regular exercise.  Pt is seeing a nutritionist.  Check labs.  Will follow.

## 2018-03-12 NOTE — Patient Instructions (Signed)
Follow up in 3-4 months to recheck diabetes We'll notify you of your lab results and make any changes if needed STOP the Terazosin and monitor your blood pressure at home.  If consistently higher than 140/90, let me know COMPLETE and RETURN the cologuard as directed Continue to work on healthy diet and regular exercise- you can do it!!! Call with any questions or concerns Happy New Year!!!

## 2018-03-13 ENCOUNTER — Encounter: Payer: Self-pay | Admitting: General Practice

## 2018-03-19 NOTE — Progress Notes (Signed)
Pt was made aware in the office to complete his cologuard.

## 2018-03-20 ENCOUNTER — Encounter: Payer: Self-pay | Admitting: Nutrition

## 2018-03-20 ENCOUNTER — Encounter: Payer: Medicare HMO | Attending: Family Medicine | Admitting: Nutrition

## 2018-03-20 DIAGNOSIS — Z1212 Encounter for screening for malignant neoplasm of rectum: Secondary | ICD-10-CM | POA: Diagnosis not present

## 2018-03-20 DIAGNOSIS — IMO0002 Reserved for concepts with insufficient information to code with codable children: Secondary | ICD-10-CM

## 2018-03-20 DIAGNOSIS — E118 Type 2 diabetes mellitus with unspecified complications: Secondary | ICD-10-CM | POA: Diagnosis not present

## 2018-03-20 DIAGNOSIS — Z1211 Encounter for screening for malignant neoplasm of colon: Secondary | ICD-10-CM | POA: Diagnosis not present

## 2018-03-20 DIAGNOSIS — E1165 Type 2 diabetes mellitus with hyperglycemia: Secondary | ICD-10-CM | POA: Diagnosis not present

## 2018-03-20 NOTE — Progress Notes (Signed)
  Medical Nutrition Therapy:  Appt start time: 0800 end time:  0830    Assessment:  Primary concerns today: DIabetes Type 2, Obesity, Hyperlipidemia.  Went to see his MD last week. George Christensen HIs A1C is down 6.7% from 7.5%. Changes Drinking only water now. Has cut down on portions, increased fresh fruits and vegetables.  Testing blood sugars twice a day. Glipizide 10 mg. Can't tolerate Metformin. Wants to lose weight.    Vitals with BMI 10/31/2017  Height 5\' 7"   Weight 338 lbs 6 oz  BMI 29.79  Systolic 892  Diastolic 81  Pulse 78  Respirations 18   CMP Latest Ref Rng & Units 03/12/2018 10/31/2017 08/04/2017  Glucose 70 - 99 mg/dL 100(H) 152(H) -  BUN 6 - 23 mg/dL 20 21 -  Creatinine 0.40 - 1.50 mg/dL 0.94 1.12 -  Sodium 135 - 145 mEq/L 138 139 -  Potassium 3.5 - 5.1 mEq/L 4.0 4.5 -  Chloride 96 - 112 mEq/L 101 102 -  CO2 19 - 32 mEq/L 27 24 -  Calcium 8.4 - 10.5 mg/dL 9.9 9.6 -  Total Protein 6.0 - 8.3 g/dL 7.0 - 6.5  Total Bilirubin 0.2 - 1.2 mg/dL 0.5 - 0.6  Alkaline Phos 39 - 117 U/L 95 - -  AST 0 - 37 U/L 21 - 15  ALT 0 - 53 U/L 18 - 18   Lab Results  Component Value Date   HGBA1C 6.7 (H) 03/12/2018   Lipid Panel     Component Value Date/Time   CHOL 128 03/12/2018 1408   TRIG 78.0 03/12/2018 1408   HDL 44.00 03/12/2018 1408   CHOLHDL 3 03/12/2018 1408   VLDL 15.6 03/12/2018 1408   LDLCALC 68 03/12/2018 1408   B) cereala  Preferred Learning Style:  No preference indicated   Learning Readiness:  Ready  Change in progress   MEDICATIONS:    DIETARY INTAKE:    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   Nutritional Diagnosis:  NB-1.1 Food and nutrition-related knowledge deficit As related to Diabetes.  As evidenced by A1C 7.4%.    Intervention:  Nutrition and Diabetes education provided on My Plate, CHO counting, meal planning,  portion sizes, timing of meals, avoiding snacks between meals unless having a low blood sugar, target ranges for A1C and blood sugars, signs/symptoms and treatment of hyper/hypoglycemia, monitoring blood sugars, taking medications as prescribed, benefits of exercising 30 minutes per day and prevention of complications of DM. George Christensen  Teaching Method Utilized:  Visual Auditory Hands on  Handouts given during visit include:  The Plate Method   Meal Plan Card  Diabetes Instructions.   Barriers to learning/adherence to lifestyle change: none  Demonstrated degree of understanding via:  Teach Back   Monitoring/Evaluation:  Dietary intake, exercise, meal planning, and body weight in 3 month(s).

## 2018-03-20 NOTE — Patient Instructions (Addendum)
Goals 1. Increase walking to 30 minutes 2-3 times per week 2. Keep drinking water 3. Continue to increase lower carb vegetables. 4. Lose 1 lb per week. Test blood sugars when symptomatic.

## 2018-03-22 LAB — COLOGUARD: Cologuard: NEGATIVE

## 2018-03-30 ENCOUNTER — Encounter: Payer: Self-pay | Admitting: Emergency Medicine

## 2018-04-05 DIAGNOSIS — R69 Illness, unspecified: Secondary | ICD-10-CM | POA: Diagnosis not present

## 2018-04-13 ENCOUNTER — Encounter: Payer: Self-pay | Admitting: Physician Assistant

## 2018-04-13 ENCOUNTER — Ambulatory Visit (INDEPENDENT_AMBULATORY_CARE_PROVIDER_SITE_OTHER): Payer: Medicare HMO | Admitting: Physician Assistant

## 2018-04-13 ENCOUNTER — Other Ambulatory Visit: Payer: Self-pay

## 2018-04-13 VITALS — BP 124/60 | HR 92 | Temp 98.5°F | Resp 16 | Ht 67.0 in | Wt 328.0 lb

## 2018-04-13 DIAGNOSIS — J069 Acute upper respiratory infection, unspecified: Secondary | ICD-10-CM | POA: Diagnosis not present

## 2018-04-13 DIAGNOSIS — R062 Wheezing: Secondary | ICD-10-CM | POA: Diagnosis not present

## 2018-04-13 DIAGNOSIS — B9789 Other viral agents as the cause of diseases classified elsewhere: Secondary | ICD-10-CM | POA: Diagnosis not present

## 2018-04-13 MED ORDER — ALBUTEROL SULFATE HFA 108 (90 BASE) MCG/ACT IN AERS: 2.0000 | INHALATION_SPRAY | Freq: Four times a day (QID) | RESPIRATORY_TRACT | 0 refills | Status: DC | PRN

## 2018-04-13 MED ORDER — ALBUTEROL SULFATE (2.5 MG/3ML) 0.083% IN NEBU
2.5000 mg | INHALATION_SOLUTION | Freq: Once | RESPIRATORY_TRACT | Status: AC
  Administered 2018-04-13: 2.5 mg via RESPIRATORY_TRACT

## 2018-04-13 MED ORDER — BENZONATATE 100 MG PO CAPS: 100.0000 mg | ORAL_CAPSULE | Freq: Three times a day (TID) | ORAL | 0 refills | Status: DC | PRN

## 2018-04-13 NOTE — Progress Notes (Signed)
Patient presents to clinic today c/o 3.5 days of nasal congestion, rhinorrhea, chest congestion and cough that is dry but consistent. Notes some mild chest tenderness. Did not a chill last night but has resolved today. Denies chest pain or SOB. Denies sinus pain, ear pain , tooth pain. Has not taken anything for symptoms.  Denies recent travel or sick contact.   Past Medical History:  Diagnosis Date  . Abnormal glucose   . Allergic rhinitis   . Allergy   . Anemia    3 yrs ago  . Cough    occ in am yellow green sputum  . Diabetes mellitus without complication ([place])    type 2  . GERD (gastroesophageal reflux disease)   . H/O dizziness   . History of chicken pox   . History of hiatal hernia   . HTN (hypertension)   . Hyperlipidemia   . Hypothyroidism   . Joint pain    hands  . Obesity   . Pancreatitis, acute 01/11/2017  . Sinusitis, acute     Current Outpatient Medications on File Prior to Visit  Medication Sig Dispense Refill  . amLODipine (NORVASC) 5 MG tablet TAKE 1 TABLET DAILY 90 tablet 1  . aspirin 81 MG tablet Take 81 mg by mouth daily.    . cetirizine (ZYRTEC) 10 MG tablet Take 10 mg by mouth daily.    . Flaxseed, Linseed, (FLAX SEED OIL) 1300 MG CAPS Take 1,300 mg by mouth daily.     Marland Kitchen glipiZIDE (GLUCOTROL XL) 10 MG 24 hr tablet TAKE 1 TABLET DAILY WITH   BREAKFAST 90 tablet 1  . glucose blood (ONETOUCH VERIO) test strip Please use one strip each time sugars are tested. Pt should test sugars twice daily. Once fasting in the morning and once 2 hours after eating. 100 each 12  . Lancets (ONETOUCH ULTRASOFT) lancets Please use one lancet each time sugars are tested. Pt should test sugars twice daily. Once fasting in the morning and once 2 hours after eating. 100 each 12  . levothyroxine (SYNTHROID, LEVOTHROID) 50 MCG tablet TAKE 1 TABLET BY MOUTH EVERY DAY 90 tablet 1  . lisinopril-hydrochlorothiazide (PRINZIDE,ZESTORETIC) 20-12.5 MG tablet TAKE 1 TABLET DAILY 90 tablet 1    . Multiple Vitamin (MULTIVITAMIN) tablet Take 1 tablet by mouth daily.    . pantoprazole (PROTONIX) 40 MG tablet TAKE 1 TABLET BY MOUTH EVERY DAY 90 tablet 2  . rosuvastatin (CRESTOR) 20 MG tablet TAKE 1 TABLET DAILY 90 tablet 0   No current facility-administered medications on file prior to visit.     No Known Allergies  Family History  Problem Relation Age of Onset  . Diabetes Mother   . Hypertension Mother   . Memory loss Mother   . Diabetes Father   . Hypertension Father   . Lung disease Father   . Stroke Father     Social History   Socioeconomic History  . Marital status: Married    Spouse name: Not on file  . Number of children: Not on file  . Years of education: Not on file  . Highest education level: Not on file  Occupational History  . Not on file  Social Needs  . Financial resource strain: Not on file  . Food insecurity:    Worry: Not on file    Inability: Not on file  . Transportation needs:    Medical: Not on file    Non-medical: Not on file  Tobacco Use  . Smoking  status: Never Smoker  . Smokeless tobacco: Never Used  Substance and Sexual Activity  . Alcohol use: No  . Drug use: No  . Sexual activity: Not on file  Lifestyle  . Physical activity:    Days per week: Not on file    Minutes per session: Not on file  . Stress: Not on file  Relationships  . Social connections:    Talks on phone: Not on file    Gets together: Not on file    Attends religious service: Not on file    Active member of club or organization: Not on file    Attends meetings of clubs or organizations: Not on file    Relationship status: Not on file  Other Topics Concern  . Not on file  Social History Narrative  . Not on file   Review of Systems - See HPI.  All other ROS are negative.  BP 124/60   Pulse 92   Temp 98.5 F (36.9 C) (Oral)   Resp 16   Ht 5\' 7"  (1.702 m)   Wt (!) 328 lb (148.8 kg)   SpO2 98%   BMI 51.37 kg/m   Physical Exam Vitals signs  reviewed.  Constitutional:      Appearance: Normal appearance. He is obese.  HENT:     Head: Normocephalic and atraumatic.  Neck:     Musculoskeletal: Neck supple.  Cardiovascular:     Rate and Rhythm: Normal rate and regular rhythm.     Heart sounds: Normal heart sounds.  Pulmonary:     Effort: Pulmonary effort is normal.     Breath sounds: Wheezing present. No rhonchi or rales.  Neurological:     General: No focal deficit present.     Mental Status: He is alert and oriented to person, place, and time.  Psychiatric:        Mood and Affect: Mood normal.     Recent Results (from the past 2160 hour(s))  Hemoglobin A1c     Status: Abnormal   Collection Time: 03/12/18  2:08 PM  Result Value Ref Range   Hgb A1c MFr Bld 6.7 (H) 4.6 - 6.5 %    Comment: Glycemic Control Guidelines for People with Diabetes:Non Diabetic:  <6%Goal of Therapy: <7%Additional Action Suggested:  >8%   Lipid panel     Status: None   Collection Time: 03/12/18  2:08 PM  Result Value Ref Range   Cholesterol 128 0 - 200 mg/dL    Comment: ATP III Classification       Desirable:  < 200 mg/dL               Borderline High:  200 - 239 mg/dL          High:  > = 240 mg/dL   Triglycerides 78.0 0.0 - 149.0 mg/dL    Comment: Normal:  <150 mg/dLBorderline High:  150 - 199 mg/dL   HDL 44.00 >39.00 mg/dL   VLDL 15.6 0.0 - 40.0 mg/dL   LDL Cholesterol 68 0 - 99 mg/dL   Total CHOL/HDL Ratio 3     Comment:                Men          Women1/2 Average Risk     3.4          3.3Average Risk          5.0          4.42X Average Risk  9.6          7.13X Average Risk          15.0          11.0                       NonHDL 83.82     Comment: NOTE:  Non-HDL goal should be 30 mg/dL higher than patient's LDL goal (i.e. LDL goal of < 70 mg/dL, would have non-HDL goal of < 100 mg/dL)  Basic metabolic panel     Status: Abnormal   Collection Time: 03/12/18  2:08 PM  Result Value Ref Range   Sodium 138 135 - 145 mEq/L   Potassium  4.0 3.5 - 5.1 mEq/L   Chloride 101 96 - 112 mEq/L   CO2 27 19 - 32 mEq/L   Glucose, Bld 100 (H) 70 - 99 mg/dL   BUN 20 6 - 23 mg/dL   Creatinine, Ser 0.94 0.40 - 1.50 mg/dL   Calcium 9.9 8.4 - 10.5 mg/dL   GFR 85.16 >60.00 mL/min  TSH     Status: None   Collection Time: 03/12/18  2:08 PM  Result Value Ref Range   TSH 2.69 0.35 - 4.50 uIU/mL  Hepatic function panel     Status: None   Collection Time: 03/12/18  2:08 PM  Result Value Ref Range   Total Bilirubin 0.5 0.2 - 1.2 mg/dL   Bilirubin, Direct 0.1 0.0 - 0.3 mg/dL   Alkaline Phosphatase 95 39 - 117 U/L   AST 21 0 - 37 U/L   ALT 18 0 - 53 U/L   Total Protein 7.0 6.0 - 8.3 g/dL   Albumin 4.4 3.5 - 5.2 g/dL  CBC with Differential/Platelet     Status: None   Collection Time: 03/12/18  2:08 PM  Result Value Ref Range   WBC 8.8 4.0 - 10.5 K/uL   RBC 5.07 4.22 - 5.81 Mil/uL   Hemoglobin 14.1 13.0 - 17.0 g/dL   HCT 42.5 39.0 - 52.0 %   MCV 83.7 78.0 - 100.0 fl   MCHC 33.1 30.0 - 36.0 g/dL   RDW 13.3 11.5 - 15.5 %   Platelets 253.0 150.0 - 400.0 K/uL   Neutrophils Relative % 70.1 43.0 - 77.0 %   Lymphocytes Relative 22.4 12.0 - 46.0 %   Monocytes Relative 5.5 3.0 - 12.0 %   Eosinophils Relative 1.5 0.0 - 5.0 %   Basophils Relative 0.5 0.0 - 3.0 %   Neutro Abs 6.2 1.4 - 7.7 K/uL   Lymphs Abs 2.0 0.7 - 4.0 K/uL   Monocytes Absolute 0.5 0.1 - 1.0 K/uL   Eosinophils Absolute 0.1 0.0 - 0.7 K/uL   Basophils Absolute 0.0 0.0 - 0.1 K/uL  PSA, Medicare     Status: None   Collection Time: 03/12/18  2:08 PM  Result Value Ref Range   PSA 1.78 0.10 - 4.00 ng/ml    Comment: Test performed using Access [employer]br[employer] PSA Assay, a parmagnetic partical, chemiluminecent immunoassay.  Cologuard     Status: None   Collection Time: 03/20/18  8:00 PM  Result Value Ref Range   Cologuard Negative Negative   Assessment/Plan: 1. Wheezing 2. Viral URI with cough Albuterol neb given with resolution in wheezing. Lungs CTAB on repeat exam. 3.5 days of  symptoms. So sign of bacterial infection on exam. Supportive measures and OTC medications reviewed. Start Mucinex OTC. Start Tessalon and Albuterol MDI. Return precautions reviewed  with patient.   Leeanne Rio, PA-C

## 2018-04-13 NOTE — Patient Instructions (Signed)
Please keep hydrated and get plenty of rest.  Start plain Mucinex to thin congestion. Use the Tessalon given as directed for cough. Keep an eye on symptoms over the weekend. If not easing up by Monday, please let us know..   How to Use a Metered Dose Inhaler A metered dose inhaler is a handheld device for taking medicine that must be breathed into the lungs (inhaled). The device can be used to deliver a variety of inhaled medicines, including:  Quick relief or rescue medicines, such as bronchodilators.  Controller medicines, such as corticosteroids. The medicine is delivered by pushing down on a metal canister to release a preset amount of spray and medicine. Each device contains the amount of medicine that is needed for a preset number of uses (inhalations). Your health care provider may recommend that you use a spacer with your inhaler to help you take the medicine more effectively. A spacer is a plastic tube with a mouthpiece on one end and an opening that connects to the inhaler on the other end. A spacer holds the medicine in a tube for a short time, which allows you to inhale more medicine. What are the risks? If you do not use your inhaler correctly, medicine might not reach your lungs to help you breathe. Inhaler medicine can cause side effects, such as:  Mouth or throat infection.  Cough.  Hoarseness.  Headache.  Nausea and vomiting.  Lung infection (pneumonia) in people who have a lung condition called COPD. How to use a metered dose inhaler without a spacer  1. Remove the cap from the inhaler. 2. If you are using the inhaler for the first time, shake it for 5 seconds, turn it away from your face, then release 4 puffs into the air. This is called priming. 3. Shake the inhaler for 5 seconds. 4. Position the inhaler so the top of the canister faces up. 5. Put your index finger on the top of the medicine canister. Support the bottom of the inhaler with your  thumb. 6. Breathe out normally and as completely as possible, away from the inhaler. 7. Either place the inhaler between your teeth and close your lips tightly around the mouthpiece, or hold the inhaler 1-2 inches (2.5-5 cm) away from your open mouth. Keep your tongue down out of the way. If you are unsure which technique to use, ask your health care provider. 8. Press the canister down with your index finger to release the medicine, then inhale deeply and slowly through your mouth (not your nose) until your lungs are completely filled. Inhaling should take 4-6 seconds. 9. Hold the medicine in your lungs for 5-10 seconds (10 seconds is best). This helps the medicine get into the small airways of your lungs. 10. With your lips in a tight circle (pursed), breathe out slowly. 11. Repeat steps 3-10 until you have taken the number of puffs that your health care provider directed. Wait about 1 minute between puffs or as directed. 12. Put the cap on the inhaler. 13. If you are using a steroid inhaler, rinse your mouth with water, gargle, and spit out the water. Do not swallow the water. How to use a metered dose inhaler with a spacer  1. Remove the cap from the inhaler. 2. If you are using the inhaler for the first time, shake it for 5 seconds, turn it away from your face, then release 4 puffs into the air. This is called priming. 3. Shake the inhaler for 5  seconds. 4. Place the open end of the spacer onto the inhaler mouthpiece. 5. Position the inhaler so the top of the canister faces up and the spacer mouthpiece faces you. 6. Put your index finger on the top of the medicine canister. Support the bottom of the inhaler and the spacer with your thumb. 7. Breathe out normally and as completely as possible, away from the spacer. 8. Place the spacer between your teeth and close your lips tightly around it. Keep your tongue down out of the way. 9. Press the canister down with your index finger to release the  medicine, then inhale deeply and slowly through your mouth (not your nose) until your lungs are completely filled. Inhaling should take 4-6 seconds. 10. Hold the medicine in your lungs for 5-10 seconds (10 seconds is best). This helps the medicine get into the small airways of your lungs. 11. With your lips in a tight circle (pursed), breathe out slowly. 12. Repeat steps 3-11 until you have taken the number of puffs that your health care provider directed. Wait about 1 minute between puffs or as directed. 13. Remove the spacer from the inhaler and put the cap on the inhaler. 14. If you are using a steroid inhaler, rinse your mouth with water, gargle, and spit out the water. Do not swallow the water. Follow these instructions at home:  Take your inhaled medicine only as told by your health care provider. Do not use the inhaler more than directed by your health care provider.  Keep all follow-up visits as told by your health care provider. This is important.  If your inhaler has a counter, you can check it to determine how full your inhaler is. If your inhaler does not have a counter, ask your health care provider when you will need to refill your inhaler and write the refill date on a calendar or on your inhaler canister. Note that you cannot know when an inhaler is empty by shaking it.  Follow directions on the package insert for care and cleaning of your inhaler and spacer. Contact a health care provider if:  Symptoms are only partially relieved with your inhaler.  You are having trouble using your inhaler.  You have an increase in phlegm.  You have headaches. Get help right away if:  You feel little or no relief after using your inhaler.  You have dizziness.  You have a fast heart rate.  You have chills or a fever.  You have night sweats.  There is blood in your phlegm. Summary  A metered dose inhaler is a handheld device for taking medicine that must be breathed into the  lungs (inhaled).  The medicine is delivered by pushing down on a metal canister to release a preset amount of spray and medicine.  Each device contains the amount of medicine that is needed for a preset number of uses (inhalations). This information is not intended to replace advice given to you by your health care provider. Make sure you discuss any questions you have with your health care provider. Document Released: 02/14/2005 Document Revised: 09/05/2016 Document Reviewed: 01/05/2016 Elsevier Interactive Patient Education  2019 [place] [employer].   Viral Respiratory Infection A viral respiratory infection is an illness that affects parts of the body that are used for breathing. These include the lungs, nose, and throat. It is caused by a germ called a virus. Some examples of this kind of infection are:  A cold.  The flu (influenza).  A respiratory  syncytial virus (RSV) infection. A person who gets this illness may have the following symptoms:  A stuffy or runny nose.  Yellow or green fluid in the nose.  A cough.  Sneezing.  Tiredness (fatigue).  Achy muscles.  A sore throat.  Sweating or chills.  A fever.  A headache. Follow these instructions at home: Managing pain and congestion  Take over-the-counter and prescription medicines only as told by your doctor.  If you have a sore throat, gargle with salt water. Do this 3-4 times per day or as needed. To make a salt-water mixture, dissolve -1 tsp of salt in 1 cup of warm water. Make sure that all the salt dissolves.  Use nose drops made from salt water. This helps with stuffiness (congestion). It also helps soften the skin around your nose.  Drink enough fluid to keep your pee (urine) pale yellow. General instructions   Rest as much as possible.  Do not drink alcohol.  Do not use any products that have nicotine or tobacco, such as cigarettes and e-cigarettes. If you need help quitting, ask your doctor.  Keep  all follow-up visits as told by your doctor. This is important. How is this prevented?   Get a flu shot every year. Ask your doctor when you should get your flu shot.  Do not let other people get your germs. If you are sick: ? Stay home from work or school. ? Wash your hands with soap and water often. Wash your hands after you cough or sneeze. If soap and water are not available, use hand sanitizer.  Avoid contact with people who are sick during cold and flu season. This is in fall and winter. Get help if:  Your symptoms last for 10 days or longer.  Your symptoms get worse over time.  You have a fever.  You have very bad pain in your face or forehead.  Parts of your jaw or neck become very swollen. Get help right away if:  You feel pain or pressure in your chest.  You have shortness of breath.  You faint or feel like you will faint.  You keep throwing up (vomiting).  You feel confused. Summary  A viral respiratory infection is an illness that affects parts of the body that are used for breathing.  Examples of this illness include a cold, the flu, and respiratory syncytial virus (RSV) infection.  The infection can cause a runny nose, cough, sneezing, sore throat, and fever.  Follow what your doctor tells you about taking medicines, drinking lots of fluid, washing your hands, resting at home, and avoiding people who are sick. This information is not intended to replace advice given to you by your health care provider. Make sure you discuss any questions you have with your health care provider. Document Released: 01/28/2008 Document Revised: 03/27/2017 Document Reviewed: 03/27/2017 Elsevier Interactive Patient Education  2019 [employer].

## 2018-04-23 ENCOUNTER — Other Ambulatory Visit: Payer: Self-pay | Admitting: Family Medicine

## 2018-04-23 MED ORDER — ROSUVASTATIN CALCIUM 20 MG PO TABS: ORAL_TABLET | ORAL | 0 refills | Status: DC

## 2018-04-23 NOTE — Telephone Encounter (Signed)
Copied from [place] [id number]. Topic: Quick Communication - Rx Refill/Question >> Apr 23, 2018  8:35 AM George Christensen R wrote: Medication: rosuvastatin (CRESTOR) 20 MG tablet  Has the patient contacted their pharmacy? Yes Preferred Pharmacy (with phone number or street name): [id number] - [place], [place] - [address] [phone] (Phone) [phone] (Fax)   Agent: Please be advised that RX refills may take up to 3 business days. We ask that you follow-up with your pharmacy.

## 2018-05-05 DIAGNOSIS — R69 Illness, unspecified: Secondary | ICD-10-CM | POA: Diagnosis not present

## 2018-05-06 ENCOUNTER — Other Ambulatory Visit: Payer: Self-pay | Admitting: Physician Assistant

## 2018-05-06 DIAGNOSIS — B9789 Other viral agents as the cause of diseases classified elsewhere: Secondary | ICD-10-CM

## 2018-05-06 DIAGNOSIS — J069 Acute upper respiratory infection, unspecified: Secondary | ICD-10-CM

## 2018-05-06 DIAGNOSIS — R062 Wheezing: Secondary | ICD-10-CM

## 2018-05-13 IMAGING — NM NM MISC PROCEDURE
6 series · 36 of 36 positions shown · non-contrast
Comparison: none

[Series 1: wbr_s-proj_st stress · 6.51mm/px · 6 of 64 frames shown (1 of 2)]
[frame 6/64]
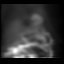
[frame 16/64]
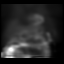
[frame 27/64]
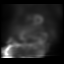
[frame 38/64]
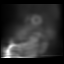
[frame 48/64]
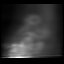
[frame 59/64]
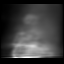

[Series 1: stress · 6.51mm/px · 6 of 512 frames shown (1 of 2)]
[frame 43/512]
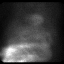
[frame 128/512]
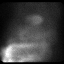
[frame 214/512]
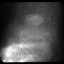
[frame 299/512]
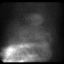
[frame 384/512]
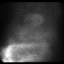
[frame 470/512]
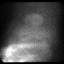

[Series 1: wbr_s-proj_st stress · 6.51mm/px · 6 of 512 frames shown (2 of 2)]
[frame 43/512]
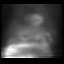
[frame 128/512]
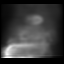
[frame 214/512]
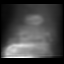
[frame 299/512]
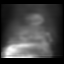
[frame 384/512]
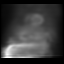
[frame 470/512]
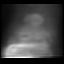

[Series 1: stress · 6.51mm/px · 6 of 64 frames shown (2 of 2)]
[frame 6/64]
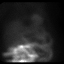
[frame 16/64]
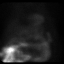
[frame 27/64]
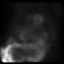
[frame 38/64]
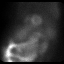
[frame 48/64]
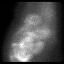
[frame 59/64]
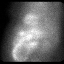

[Series 2: wbr_r-proj_st rest · 6.51mm/px · 6 of 64 frames shown]
[frame 6/64]
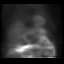
[frame 16/64]
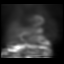
[frame 27/64]
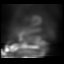
[frame 38/64]
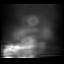
[frame 48/64]
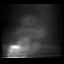
[frame 59/64]
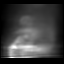

[Series 2: rest · 6.51mm/px · 6 of 64 frames shown]
[frame 6/64]
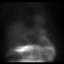
[frame 16/64]
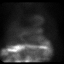
[frame 27/64]
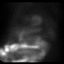
[frame 38/64]
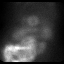
[frame 48/64]
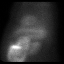
[frame 59/64]
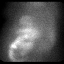

[36 of 36 positions shown; findings below may reference images not displayed]

Canned report from images found in remote index.

Refer to host system for actual result text.

## 2018-05-16 ENCOUNTER — Other Ambulatory Visit: Payer: Self-pay | Admitting: Family Medicine

## 2018-06-19 ENCOUNTER — Ambulatory Visit: Payer: Medicare HMO | Admitting: Nutrition

## 2018-07-09 ENCOUNTER — Encounter: Payer: Self-pay | Admitting: Family Medicine

## 2018-07-09 ENCOUNTER — Other Ambulatory Visit: Payer: Self-pay

## 2018-07-09 ENCOUNTER — Ambulatory Visit (INDEPENDENT_AMBULATORY_CARE_PROVIDER_SITE_OTHER): Payer: Medicare HMO | Admitting: Family Medicine

## 2018-07-09 VITALS — BP 133/80 | HR 90 | Temp 98.3°F | Resp 16 | Wt 344.1 lb

## 2018-07-09 DIAGNOSIS — E119 Type 2 diabetes mellitus without complications: Secondary | ICD-10-CM | POA: Diagnosis not present

## 2018-07-09 LAB — BASIC METABOLIC PANEL
BUN: 15 mg/dL (ref 6–23)
CO2: 30 mEq/L (ref 19–32)
Calcium: 9.4 mg/dL (ref 8.4–10.5)
Chloride: 101 mEq/L (ref 96–112)
Creatinine, Ser: 0.95 mg/dL (ref 0.40–1.50)
GFR: 79.07 mL/min (ref >60.00)
Glucose, Bld: 146 mg/dL — ABNORMAL HIGH (ref 70–99)
Potassium: 4.5 mEq/L (ref 3.5–5.1)
Sodium: 139 mEq/L (ref 135–145)

## 2018-07-09 LAB — HEMOGLOBIN A1C: Hgb A1c MFr Bld: 7 % — ABNORMAL HIGH (ref 4.6–6.5)

## 2018-07-09 MED ORDER — AMLODIPINE BESYLATE 5 MG PO TABS: 5.0000 mg | ORAL_TABLET | Freq: Every day | ORAL | 1 refills | Status: DC

## 2018-07-09 MED ORDER — LISINOPRIL-HYDROCHLOROTHIAZIDE 20-12.5 MG PO TABS: 1.0000 | ORAL_TABLET | Freq: Every day | ORAL | 1 refills | Status: DC

## 2018-07-09 MED ORDER — ONETOUCH DELICA LANCETS 33G MISC: 12 refills | Status: AC

## 2018-07-09 MED ORDER — GLIPIZIDE ER 10 MG PO TB24: 10.0000 mg | ORAL_TABLET | Freq: Every day | ORAL | 1 refills | Status: DC

## 2018-07-09 MED ORDER — TERAZOSIN HCL 10 MG PO CAPS: 10.0000 mg | ORAL_CAPSULE | Freq: Every day | ORAL | 1 refills | Status: DC

## 2018-07-09 NOTE — Progress Notes (Signed)
   Subjective:    Patient ID: George Christensen, male    DOB: 1950-05-01, 68 y.o.   MRN: [id number]  HPI DM- chronic problem, on Glipizide 10mg  daily.  UTD on foot exam, eye exam.  On ACE for renal protection.  Pt has gained 7 lbs since last visit.  Pt admits to poor eating due to COVID.  No CP, SOB, HAs, visual changes, abd pain, N/V.  Denies symptomatic lows.  No numbness/tingling of hands/feet.   Review of Systems For ROS see HPI     Objective:   Physical Exam Vitals signs reviewed.  Constitutional:      General: He is not in acute distress.    Appearance: He is well-developed. He is obese.  HENT:     Head: Normocephalic and atraumatic.  Eyes:     Conjunctiva/sclera: Conjunctivae normal.     Pupils: Pupils are equal, round, and reactive to light.  Neck:     Musculoskeletal: Normal range of motion and neck supple.     Thyroid: No thyromegaly.  Cardiovascular:     Rate and Rhythm: Normal rate and regular rhythm.     Heart sounds: Normal heart sounds. No murmur.  Pulmonary:     Effort: Pulmonary effort is normal. No respiratory distress.     Breath sounds: Normal breath sounds.  Abdominal:     General: Bowel sounds are normal. There is no distension.     Palpations: Abdomen is soft.  Lymphadenopathy:     Cervical: No cervical adenopathy.  Skin:    General: Skin is warm and dry.  Neurological:     Mental Status: He is alert and oriented to person, place, and time.     Cranial Nerves: No cranial nerve deficit.  Psychiatric:        Behavior: Behavior normal.           Assessment & Plan:

## 2018-07-09 NOTE — Assessment & Plan Note (Signed)
Deteriorated.  Pt has gained 7 lbs.  Stressed need for healthy diet and regular exercise.  Will continue to follow.

## 2018-07-09 NOTE — Patient Instructions (Signed)
Follow up in 3-4 months to recheck diabetes, cholesterol, BP We'll notify you of your lab results and make any changes if needed Continue to work on healthy diet and regular exercise- you can do it! Call with any questions or concerns Hang in there!

## 2018-07-09 NOTE — Assessment & Plan Note (Signed)
Chronic problem.  Currently asymptomatic.  Stressed need for healthy diet and regular exercise as pt has gained 7 lbs.  UTD on eye exam, foot exam done today.  On ACE for renal protection.  Check labs.  Adjust meds prn

## 2018-07-10 ENCOUNTER — Encounter: Payer: Self-pay | Admitting: General Practice

## 2018-07-17 ENCOUNTER — Other Ambulatory Visit: Payer: Self-pay | Admitting: Family Medicine

## 2018-10-09 ENCOUNTER — Other Ambulatory Visit: Payer: Self-pay | Admitting: Family Medicine

## 2018-10-25 DIAGNOSIS — E119 Type 2 diabetes mellitus without complications: Secondary | ICD-10-CM | POA: Diagnosis not present

## 2018-10-25 DIAGNOSIS — Z7984 Long term (current) use of oral hypoglycemic drugs: Secondary | ICD-10-CM | POA: Diagnosis not present

## 2018-10-25 DIAGNOSIS — H52223 Regular astigmatism, bilateral: Secondary | ICD-10-CM | POA: Diagnosis not present

## 2018-10-25 DIAGNOSIS — H25093 Other age-related incipient cataract, bilateral: Secondary | ICD-10-CM | POA: Diagnosis not present

## 2018-10-25 DIAGNOSIS — I10 Essential (primary) hypertension: Secondary | ICD-10-CM | POA: Diagnosis not present

## 2018-10-25 DIAGNOSIS — H5203 Hypermetropia, bilateral: Secondary | ICD-10-CM | POA: Diagnosis not present

## 2018-10-25 DIAGNOSIS — H524 Presbyopia: Secondary | ICD-10-CM | POA: Diagnosis not present

## 2018-10-25 LAB — HM DIABETES EYE EXAM

## 2018-11-16 ENCOUNTER — Encounter: Payer: Self-pay | Admitting: Family Medicine

## 2018-11-16 ENCOUNTER — Ambulatory Visit (INDEPENDENT_AMBULATORY_CARE_PROVIDER_SITE_OTHER): Payer: Medicare HMO | Admitting: Family Medicine

## 2018-11-16 ENCOUNTER — Other Ambulatory Visit: Payer: Self-pay

## 2018-11-16 VITALS — BP 130/80 | HR 78 | Temp 97.9°F | Resp 16 | Ht 67.0 in | Wt 349.5 lb

## 2018-11-16 DIAGNOSIS — E1169 Type 2 diabetes mellitus with other specified complication: Secondary | ICD-10-CM | POA: Diagnosis not present

## 2018-11-16 DIAGNOSIS — I10 Essential (primary) hypertension: Secondary | ICD-10-CM

## 2018-11-16 DIAGNOSIS — E039 Hypothyroidism, unspecified: Secondary | ICD-10-CM | POA: Diagnosis not present

## 2018-11-16 DIAGNOSIS — E119 Type 2 diabetes mellitus without complications: Secondary | ICD-10-CM

## 2018-11-16 DIAGNOSIS — E785 Hyperlipidemia, unspecified: Secondary | ICD-10-CM

## 2018-11-16 LAB — CBC WITH DIFFERENTIAL/PLATELET
Basophils Absolute: 0 10*3/uL (ref 0.0–0.1)
Basophils Relative: 0.5 % (ref 0.0–3.0)
Eosinophils Absolute: 0.2 10*3/uL (ref 0.0–0.7)
Eosinophils Relative: 2.8 % (ref 0.0–5.0)
HCT: 38.6 % — ABNORMAL LOW (ref 39.0–52.0)
Hemoglobin: 12.7 g/dL — ABNORMAL LOW (ref 13.0–17.0)
Lymphocytes Relative: 24.4 % (ref 12.0–46.0)
Lymphs Abs: 1.8 10*3/uL (ref 0.7–4.0)
MCHC: 32.9 g/dL (ref 30.0–36.0)
MCV: 85.9 fl (ref 78.0–100.0)
Monocytes Absolute: 0.4 10*3/uL (ref 0.1–1.0)
Monocytes Relative: 5.3 % (ref 3.0–12.0)
Neutro Abs: 4.8 10*3/uL (ref 1.4–7.7)
Neutrophils Relative %: 67 % (ref 43.0–77.0)
Platelets: 220 10*3/uL (ref 150.0–400.0)
RBC: 4.49 Mil/uL (ref 4.22–5.81)
RDW: 14.5 % (ref 11.5–15.5)
WBC: 7.2 10*3/uL (ref 4.0–10.5)

## 2018-11-16 LAB — LIPID PANEL
Cholesterol: 116 mg/dL (ref 0–200)
HDL: 37.3 mg/dL — ABNORMAL LOW (ref >39.00)
LDL Cholesterol: 65 mg/dL (ref 0–99)
NonHDL: 78.87
Total CHOL/HDL Ratio: 3
Triglycerides: 69 mg/dL (ref 0.0–149.0)
VLDL: 13.8 mg/dL (ref 0.0–40.0)

## 2018-11-16 LAB — HEPATIC FUNCTION PANEL
ALT: 18 U/L (ref 0–53)
AST: 20 U/L (ref 0–37)
Albumin: 3.8 g/dL (ref 3.5–5.2)
Alkaline Phosphatase: 99 U/L (ref 39–117)
Bilirubin, Direct: 0.1 mg/dL (ref 0.0–0.3)
Total Bilirubin: 0.5 mg/dL (ref 0.2–1.2)
Total Protein: 6.2 g/dL (ref 6.0–8.3)

## 2018-11-16 LAB — HEMOGLOBIN A1C: Hgb A1c MFr Bld: 7.6 % — ABNORMAL HIGH (ref 4.6–6.5)

## 2018-11-16 LAB — BASIC METABOLIC PANEL
BUN: 14 mg/dL (ref 6–23)
CO2: 30 mEq/L (ref 19–32)
Calcium: 9.3 mg/dL (ref 8.4–10.5)
Chloride: 101 mEq/L (ref 96–112)
Creatinine, Ser: 0.93 mg/dL (ref 0.40–1.50)
GFR: 80.95 mL/min (ref >60.00)
Glucose, Bld: 142 mg/dL — ABNORMAL HIGH (ref 70–99)
Potassium: 4.4 mEq/L (ref 3.5–5.1)
Sodium: 139 mEq/L (ref 135–145)

## 2018-11-16 LAB — TSH: TSH: 3.52 u[IU]/mL (ref 0.35–4.50)

## 2018-11-16 NOTE — Assessment & Plan Note (Signed)
Pt has gained another 5 lbs.  BMI now 54.74.  Stressed need for healthy diet and regular exercise.  Will continue to follow.

## 2018-11-16 NOTE — Assessment & Plan Note (Signed)
Currently asymptomatic.  Check labs.  Adjust meds prn

## 2018-11-16 NOTE — Assessment & Plan Note (Signed)
Chronic problem.  Hx of adequate control.  UTD on foot exam, eye exam.  On ACE for renal protection.  Stressed need for healthy diet and regular exercise.  Refusing flu and pneumonia shots.  Check labs.  Adjust meds prn

## 2018-11-16 NOTE — Assessment & Plan Note (Signed)
Chronic problem.  Tolerating statin w/o difficulty.  Check labs.  Adjust meds prn  

## 2018-11-16 NOTE — Patient Instructions (Signed)
Follow up in 3-4 months to recheck diabetes We'll notify you of your lab results and make any changes if needed Continue to work on healthy diet and regular exercise- you can do it!! Call with any questions or concerns Don't let the house drive you crazy!!! Stay Safe!

## 2018-11-16 NOTE — Assessment & Plan Note (Signed)
Chronic problem, well controlled today.  Asymptomatic.  Check labs.  No anticipated med changes.  Will follow. 

## 2018-11-16 NOTE — Progress Notes (Signed)
   Subjective:    Patient ID: George Christensen, male    DOB: 1950-05-23, 68 y.o.   MRN: [id number]  HPI HTN- chronic problem, on Amlodipine 5mg  daily, Lisinopril HCTZ 20/12.5mg  daily, Terazosin 10mg  daily w/ good control.  No CP, SOB, HAs, visual changes, edema  Hyperlipidemia- chronic problem, on Crestor 20mg  daily.  No abd pain, N/V  DM- chronic problem, on Glipizide 10mg  daily. UTD on foot exam.  On ACE for renal protection.  UTD on eye exam (10/25/18)  Rare symptomatic lows if he is late eating lunch.  No numbness/tingling of hands/feet.  Morbid obesity- pt has gained 5 lbs since last visit  Hypothyroid- pt on Levothyroxine 7mcg daily.  Denies fatigue, changes to skin/hair/nails  Review of Systems For ROS see HPI     Objective:   Physical Exam Vitals signs reviewed.  Constitutional:      General: He is not in acute distress.    Appearance: He is well-developed. He is obese.  HENT:     Head: Normocephalic and atraumatic.  Eyes:     Conjunctiva/sclera: Conjunctivae normal.     Pupils: Pupils are equal, round, and reactive to light.  Neck:     Musculoskeletal: Normal range of motion and neck supple.     Thyroid: No thyromegaly.  Cardiovascular:     Rate and Rhythm: Normal rate and regular rhythm.     Heart sounds: Normal heart sounds. No murmur.  Pulmonary:     Effort: Pulmonary effort is normal. No respiratory distress.     Breath sounds: Normal breath sounds.  Abdominal:     General: Bowel sounds are normal. There is no distension.     Palpations: Abdomen is soft.  Lymphadenopathy:     Cervical: No cervical adenopathy.  Skin:    General: Skin is warm and dry.  Neurological:     Mental Status: He is alert and oriented to person, place, and time.     Cranial Nerves: No cranial nerve deficit.  Psychiatric:        Behavior: Behavior normal.           Assessment & Plan:

## 2018-11-19 ENCOUNTER — Encounter: Payer: Self-pay | Admitting: General Practice

## 2019-01-05 ENCOUNTER — Other Ambulatory Visit: Payer: Self-pay | Admitting: Family Medicine

## 2019-01-11 ENCOUNTER — Other Ambulatory Visit: Payer: Self-pay | Admitting: Family Medicine

## 2019-02-12 ENCOUNTER — Other Ambulatory Visit: Payer: Self-pay | Admitting: Family Medicine

## 2019-03-06 ENCOUNTER — Ambulatory Visit (INDEPENDENT_AMBULATORY_CARE_PROVIDER_SITE_OTHER): Payer: Medicare HMO | Admitting: Family Medicine

## 2019-03-06 ENCOUNTER — Encounter: Payer: Self-pay | Admitting: Family Medicine

## 2019-03-06 ENCOUNTER — Other Ambulatory Visit: Payer: Self-pay

## 2019-03-06 VITALS — BP 128/81 | HR 81 | Temp 97.4°F | Resp 17 | Ht 67.0 in | Wt 342.1 lb

## 2019-03-06 DIAGNOSIS — E119 Type 2 diabetes mellitus without complications: Secondary | ICD-10-CM | POA: Diagnosis not present

## 2019-03-06 LAB — BASIC METABOLIC PANEL
BUN: 16 mg/dL (ref 6–23)
CO2: 29 mEq/L (ref 19–32)
Calcium: 9.5 mg/dL (ref 8.4–10.5)
Chloride: 101 mEq/L (ref 96–112)
Creatinine, Ser: 0.93 mg/dL (ref 0.40–1.50)
GFR: 80.88 mL/min (ref >60.00)
Glucose, Bld: 170 mg/dL — ABNORMAL HIGH (ref 70–99)
Potassium: 4.4 mEq/L (ref 3.5–5.1)
Sodium: 138 mEq/L (ref 135–145)

## 2019-03-06 LAB — HEMOGLOBIN A1C: Hgb A1c MFr Bld: 7.6 % — ABNORMAL HIGH (ref 4.6–6.5)

## 2019-03-06 NOTE — Progress Notes (Signed)
   Subjective:    Patient ID: George Christensen, male    DOB: 1950-04-09, 69 y.o.   MRN: [id number]  HPI DM- chronic problem, on Glipizide XL 10mg  w/ hx of adequate control.  On ACE for renal protection.  UTD on foot exam, eye exam.  Is down 8 lbs since last visit.  Pt reports sugars have been elevated due to holidays.  Denies symptomatic lows.  No CP, SOB, HAs, visual changes, abd pain, N/V.  No numbness/tingling of hands/feet.  Obesity- pt is down 8 lbs since last visit.  Pt reports he has lost weight by changing diet and working on the house.  Hand pain- L hand, started 2 weeks ago.  Unable to make fist.  Had swelling of 1st finger and MCP joint.  Painful to grip.  No known injury.  Denies redness or warmth.  R hand dominant.  Pt is doing a lot of chainsaw work b/c he's building a new house.     Review of Systems For ROS see HPI   This visit occurred during the SARS-CoV-2 public health emergency.  Safety protocols were in place, including screening questions prior to the visit, additional usage of staff PPE, and extensive cleaning of exam room while observing appropriate contact time as indicated for disinfecting solutions.       Objective:   Physical Exam Vitals reviewed.  Constitutional:      General: He is not in acute distress.    Appearance: He is well-developed. He is obese.  HENT:     Head: Normocephalic and atraumatic.  Eyes:     Conjunctiva/sclera: Conjunctivae normal.     Pupils: Pupils are equal, round, and reactive to light.  Neck:     Thyroid: No thyromegaly.  Cardiovascular:     Rate and Rhythm: Normal rate and regular rhythm.     Heart sounds: Normal heart sounds. No murmur.  Pulmonary:     Effort: Pulmonary effort is normal. No respiratory distress.     Breath sounds: Normal breath sounds.  Abdominal:     General: Bowel sounds are normal. There is no distension.     Palpations: Abdomen is soft.  Musculoskeletal:        General: Swelling (mild swelling of L  index finger w/ mildly decreased flexion) present. No deformity.     Cervical back: Normal range of motion and neck supple.  Lymphadenopathy:     Cervical: No cervical adenopathy.  Skin:    General: Skin is warm and dry.  Neurological:     Mental Status: He is alert and oriented to person, place, and time.     Cranial Nerves: No cranial nerve deficit.  Psychiatric:        Behavior: Behavior normal.           Assessment & Plan:  L hand pain- new.  Suspect that this is due to his recent chainsaw use and tight grip.  No obvious abnormality on PE.  Some swelling of index MCP and PIP.  Start OTC Voltaren gel.  If no improvement, will refer to hand specialist.  Pt expressed understanding and is in agreement w/ plan.

## 2019-03-06 NOTE — Assessment & Plan Note (Signed)
Chronic problem.  UTD on foot exam, eye exam.  On ACE for renal protection.  Pt reports sugars have been elevated due to holiday eating.  He is down 8 lbs since last visit- applauded his efforts.  Encouraged him to continue.  Check labs.  Adjust meds prn

## 2019-03-06 NOTE — Patient Instructions (Signed)
Schedule your complete physical in 3-4 months We'll notify you of your lab results and make any changes if needed Continue to work on healthy diet and regular exercise- you can do it! Start with OTC Voltaren gel for the hand and finger.  If no improvement in the next week or so, let me know and we can refer to a hand specialist Call with any questions or concerns Stay Safe!  Stay Healthy! Happy New Year!

## 2019-03-06 NOTE — Assessment & Plan Note (Signed)
Ongoing issue.  He has lost 8 lbs since last visit.  Applauded his efforts, will continue to follow.

## 2019-03-07 ENCOUNTER — Encounter: Payer: Self-pay | Admitting: General Practice

## 2019-03-13 ENCOUNTER — Other Ambulatory Visit: Payer: Self-pay | Admitting: Family Medicine

## 2019-03-26 ENCOUNTER — Other Ambulatory Visit: Payer: Self-pay | Admitting: Family Medicine

## 2019-03-26 DIAGNOSIS — R69 Illness, unspecified: Secondary | ICD-10-CM | POA: Diagnosis not present

## 2019-04-01 ENCOUNTER — Telehealth: Payer: Self-pay | Admitting: *Deleted

## 2019-04-01 DIAGNOSIS — M79642 Pain in left hand: Secondary | ICD-10-CM

## 2019-04-01 NOTE — Telephone Encounter (Signed)
This referral was placed today.  

## 2019-04-01 NOTE — Telephone Encounter (Signed)
Hand still swelling with low range of motion need referral to ortho  Stated medication help with movement but still swelling  Requesting referral send to Dr Roseanne Kaufman III with [facility]

## 2019-04-01 NOTE — Telephone Encounter (Signed)
[place] for referral to Hand Surgery, Dr Veronia Beets, dx L hand pain

## 2019-04-30 DIAGNOSIS — R69 Illness, unspecified: Secondary | ICD-10-CM | POA: Diagnosis not present

## 2019-05-27 DIAGNOSIS — R69 Illness, unspecified: Secondary | ICD-10-CM | POA: Diagnosis not present

## 2019-06-05 DIAGNOSIS — M65842 Other synovitis and tenosynovitis, left hand: Secondary | ICD-10-CM | POA: Diagnosis not present

## 2019-06-05 DIAGNOSIS — M79642 Pain in left hand: Secondary | ICD-10-CM | POA: Diagnosis not present

## 2019-06-12 ENCOUNTER — Telehealth: Payer: Self-pay | Admitting: Family Medicine

## 2019-06-12 NOTE — Progress Notes (Signed)
  Chronic Care Management   Note  06/12/2019 Name: George Christensen MRN: [id number] DOB: [birth year]  George Christensen is a 69 y.o. year old male who is a primary care patient of Birdie Riddle, Aundra Millet, MD. I reached out to Cheral Almas by phone today in response to a referral sent by George Christensen's PCP, Midge Minium, MD.   [name] was given information about Chronic Care Management services today including:  1. CCM service includes personalized support from designated clinical staff supervised by his physician, including individualized plan of care and coordination with other care providers 2. 24/7 contact phone numbers for assistance for urgent and routine care needs. 3. Service will only be billed when office clinical staff spend 20 minutes or more in a month to coordinate care. 4. Only one practitioner may furnish and bill the service in a calendar month. 5. The patient may stop CCM services at any time (effective at the end of the month) by phone call to the office staff.   Patient did not agree to services and wishes to consider information provided before deciding about enrollment in care management services.   Follow up plan:   Earney [facility] [employer] [facility]

## 2019-06-23 ENCOUNTER — Other Ambulatory Visit: Payer: Self-pay | Admitting: Family Medicine

## 2019-06-24 ENCOUNTER — Other Ambulatory Visit: Payer: Self-pay | Admitting: Family Medicine

## 2019-06-28 DIAGNOSIS — R69 Illness, unspecified: Secondary | ICD-10-CM | POA: Diagnosis not present

## 2019-07-01 ENCOUNTER — Other Ambulatory Visit: Payer: Self-pay | Admitting: Family Medicine

## 2019-07-04 ENCOUNTER — Encounter: Payer: Medicare HMO | Admitting: Family Medicine

## 2019-07-05 ENCOUNTER — Other Ambulatory Visit: Payer: Self-pay

## 2019-07-05 ENCOUNTER — Ambulatory Visit (INDEPENDENT_AMBULATORY_CARE_PROVIDER_SITE_OTHER): Payer: Medicare HMO | Admitting: Family Medicine

## 2019-07-05 ENCOUNTER — Encounter: Payer: Self-pay | Admitting: Family Medicine

## 2019-07-05 VITALS — BP 131/86 | HR 79 | Temp 97.9°F | Resp 16 | Ht 67.0 in | Wt 353.4 lb

## 2019-07-05 DIAGNOSIS — E119 Type 2 diabetes mellitus without complications: Secondary | ICD-10-CM

## 2019-07-05 DIAGNOSIS — Z0000 Encounter for general adult medical examination without abnormal findings: Secondary | ICD-10-CM | POA: Diagnosis not present

## 2019-07-05 DIAGNOSIS — Z125 Encounter for screening for malignant neoplasm of prostate: Secondary | ICD-10-CM | POA: Diagnosis not present

## 2019-07-05 NOTE — Assessment & Plan Note (Signed)
Chronic problem.  Stressed need for healthy diet and some regular exercise as pt has gained 11 lbs since last visit.  UTD on eye exam.  Foot exam done today.  Check labs.  Adjust meds prn

## 2019-07-05 NOTE — Assessment & Plan Note (Signed)
Pt's PE WNL w/ exception of obesity.  UTD on cologuard.  Declines PNA vaccines.  Stressed need for healthy diet and regular exercise.  Check labs.  Anticipatory guidance provided.

## 2019-07-05 NOTE — Addendum Note (Signed)
Addended by: Leonidas Romberg on: 07/05/2019 08:44 AM   Modules accepted: Orders

## 2019-07-05 NOTE — Patient Instructions (Signed)
Follow up in 3-4 months to recheck diabetes and your Wellness Visit We'll notify you of your lab results and make any changes if needed Continue to work on healthy diet and regular exercise- you can do it!!! Please get your COVID vaccines Call with any questions or concerns Happy [place] Birthday!

## 2019-07-05 NOTE — Progress Notes (Signed)
   Subjective:    Patient ID: George Christensen, male    DOB: June 11, 1950, 69 y.o.   MRN: [id number]  HPI CPE- UTD on cologuard.  Due for PNA vaccines- 'I don't do any vaccines'.  Pt has gained 11 lbs since last visit.  UTD on eye exam, due for foot exam.  On ACE for renal protection.   Review of Systems Patient reports no vision/hearing changes, anorexia, fever ,adenopathy, persistant/recurrent hoarseness, swallowing issues, chest pain, palpitations, edema, persistant/recurrent cough, hemoptysis, dyspnea (rest,exertional, paroxysmal nocturnal), gastrointestinal  bleeding (melena, rectal bleeding), abdominal pain, excessive heart burn, GU symptoms (dysuria, hematuria, voiding/incontinence issues) syncope, focal weakness, memory loss, numbness & tingling, skin/hair/nail changes, depression, anxiety, abnormal bruising/bleeding, musculoskeletal symptoms/signs.   This visit occurred during the SARS-CoV-2 public health emergency.  Safety protocols were in place, including screening questions prior to the visit, additional usage of staff PPE, and extensive cleaning of exam room while observing appropriate contact time as indicated for disinfecting solutions.       Objective:   Physical Exam General Appearance:    Alert, cooperative, no distress, appears stated age, morbidly obese  Head:    Normocephalic, without obvious abnormality, atraumatic  Eyes:    PERRL, conjunctiva/corneas clear, EOM's intact, fundi    benign, both eyes       Ears:    Normal TM's and external ear canals, both ears  Nose:   Deferred due to COVID  Throat:   Neck:   Supple, symmetrical, trachea midline, no adenopathy;       thyroid:  No enlargement/tenderness/nodules  Back:     Symmetric, no curvature, ROM normal, no CVA tenderness  Lungs:     Clear to auscultation bilaterally, respirations unlabored  Chest wall:    No tenderness or deformity  Heart:    Regular rate and rhythm, S1 and S2 normal, no murmur, rub   or gallop    Abdomen:     Soft, non-tender, bowel sounds active all four quadrants,    obese  Genitalia:    deferred  Rectal:    Extremities:   Extremities normal, atraumatic, no cyanosis or edema  Pulses:   2+ and symmetric all extremities  Skin:   Skin color, texture, turgor normal, no rashes or lesions  Lymph nodes:   Cervical, supraclavicular, and axillary nodes normal  Neurologic:   CNII-XII intact. Normal strength, sensation and reflexes      throughout          Assessment & Plan:

## 2019-07-05 NOTE — Assessment & Plan Note (Signed)
Deteriorated.  Pt has gained 11 lbs since last visit.  Stressed need for healthy diet and regular exercise.  Will follow.

## 2019-07-08 ENCOUNTER — Other Ambulatory Visit: Payer: Self-pay

## 2019-07-08 ENCOUNTER — Encounter: Payer: Self-pay | Admitting: General Practice

## 2019-07-08 ENCOUNTER — Other Ambulatory Visit (INDEPENDENT_AMBULATORY_CARE_PROVIDER_SITE_OTHER): Payer: Medicare HMO

## 2019-07-08 DIAGNOSIS — E119 Type 2 diabetes mellitus without complications: Secondary | ICD-10-CM

## 2019-07-08 DIAGNOSIS — Z125 Encounter for screening for malignant neoplasm of prostate: Secondary | ICD-10-CM

## 2019-07-08 LAB — BASIC METABOLIC PANEL
BUN: 15 mg/dL (ref 6–23)
CO2: 30 mEq/L (ref 19–32)
Calcium: 9.2 mg/dL (ref 8.4–10.5)
Chloride: 101 mEq/L (ref 96–112)
Creatinine, Ser: 0.93 mg/dL (ref 0.40–1.50)
GFR: 80.79 mL/min (ref >60.00)
Glucose, Bld: 154 mg/dL — ABNORMAL HIGH (ref 70–99)
Potassium: 4.4 mEq/L (ref 3.5–5.1)
Sodium: 138 mEq/L (ref 135–145)

## 2019-07-08 LAB — CBC WITH DIFFERENTIAL/PLATELET
Basophils Absolute: 0 10*3/uL (ref 0.0–0.1)
Basophils Relative: 0.6 % (ref 0.0–3.0)
Eosinophils Absolute: 0.2 10*3/uL (ref 0.0–0.7)
Eosinophils Relative: 2.5 % (ref 0.0–5.0)
HCT: 40.3 % (ref 39.0–52.0)
Hemoglobin: 13.3 g/dL (ref 13.0–17.0)
Lymphocytes Relative: 22.9 % (ref 12.0–46.0)
Lymphs Abs: 1.8 10*3/uL (ref 0.7–4.0)
MCHC: 32.9 g/dL (ref 30.0–36.0)
MCV: 85.7 fl (ref 78.0–100.0)
Monocytes Absolute: 0.5 10*3/uL (ref 0.1–1.0)
Monocytes Relative: 6.1 % (ref 3.0–12.0)
Neutro Abs: 5.4 10*3/uL (ref 1.4–7.7)
Neutrophils Relative %: 67.9 % (ref 43.0–77.0)
Platelets: 225 10*3/uL (ref 150.0–400.0)
RBC: 4.7 Mil/uL (ref 4.22–5.81)
RDW: 13.6 % (ref 11.5–15.5)
WBC: 7.9 10*3/uL (ref 4.0–10.5)

## 2019-07-08 LAB — HEPATIC FUNCTION PANEL
ALT: 18 U/L (ref 0–53)
AST: 17 U/L (ref 0–37)
Albumin: 3.9 g/dL (ref 3.5–5.2)
Alkaline Phosphatase: 109 U/L (ref 39–117)
Bilirubin, Direct: 0.1 mg/dL (ref 0.0–0.3)
Total Bilirubin: 0.4 mg/dL (ref 0.2–1.2)
Total Protein: 6.3 g/dL (ref 6.0–8.3)

## 2019-07-08 LAB — LIPID PANEL
Cholesterol: 133 mg/dL (ref 0–200)
HDL: 39 mg/dL — ABNORMAL LOW (ref >39.00)
LDL Cholesterol: 77 mg/dL (ref 0–99)
NonHDL: 94.31
Total CHOL/HDL Ratio: 3
Triglycerides: 87 mg/dL (ref 0.0–149.0)
VLDL: 17.4 mg/dL (ref 0.0–40.0)

## 2019-07-08 LAB — PSA, MEDICARE: PSA: 1.44 ng/ml (ref 0.10–4.00)

## 2019-07-08 LAB — HEMOGLOBIN A1C: Hgb A1c MFr Bld: 8.1 % — ABNORMAL HIGH (ref 4.6–6.5)

## 2019-07-08 LAB — TSH: TSH: 4.09 u[IU]/mL (ref 0.35–4.50)

## 2019-07-21 ENCOUNTER — Other Ambulatory Visit: Payer: Self-pay | Admitting: Family Medicine

## 2019-10-09 ENCOUNTER — Telehealth: Payer: Self-pay | Admitting: Family Medicine

## 2019-10-09 NOTE — Telephone Encounter (Signed)
LVM to r/s appt on 11/12/19 due to [facility] schedule change.

## 2019-10-14 ENCOUNTER — Other Ambulatory Visit: Payer: Self-pay | Admitting: Family Medicine

## 2019-10-29 DIAGNOSIS — H5203 Hypermetropia, bilateral: Secondary | ICD-10-CM | POA: Diagnosis not present

## 2019-10-29 DIAGNOSIS — H25093 Other age-related incipient cataract, bilateral: Secondary | ICD-10-CM | POA: Diagnosis not present

## 2019-10-29 DIAGNOSIS — E119 Type 2 diabetes mellitus without complications: Secondary | ICD-10-CM | POA: Diagnosis not present

## 2019-10-29 DIAGNOSIS — H524 Presbyopia: Secondary | ICD-10-CM | POA: Diagnosis not present

## 2019-10-29 DIAGNOSIS — H52223 Regular astigmatism, bilateral: Secondary | ICD-10-CM | POA: Diagnosis not present

## 2019-10-29 DIAGNOSIS — Z7984 Long term (current) use of oral hypoglycemic drugs: Secondary | ICD-10-CM | POA: Diagnosis not present

## 2019-10-29 DIAGNOSIS — I10 Essential (primary) hypertension: Secondary | ICD-10-CM | POA: Diagnosis not present

## 2019-11-06 ENCOUNTER — Ambulatory Visit: Payer: Medicare HMO

## 2019-11-06 ENCOUNTER — Ambulatory Visit: Payer: Medicare HMO | Admitting: Family Medicine

## 2019-11-07 ENCOUNTER — Other Ambulatory Visit: Payer: Self-pay | Admitting: Family Medicine

## 2019-11-19 ENCOUNTER — Other Ambulatory Visit: Payer: Self-pay

## 2019-11-19 ENCOUNTER — Ambulatory Visit (INDEPENDENT_AMBULATORY_CARE_PROVIDER_SITE_OTHER): Payer: Medicare HMO | Admitting: Family Medicine

## 2019-11-19 ENCOUNTER — Ambulatory Visit: Payer: Medicare HMO

## 2019-11-19 ENCOUNTER — Encounter: Payer: Self-pay | Admitting: Family Medicine

## 2019-11-19 VITALS — BP 126/83 | HR 80 | Temp 98.0°F | Resp 16 | Ht 67.0 in | Wt 343.2 lb

## 2019-11-19 DIAGNOSIS — E119 Type 2 diabetes mellitus without complications: Secondary | ICD-10-CM

## 2019-11-19 DIAGNOSIS — E785 Hyperlipidemia, unspecified: Secondary | ICD-10-CM | POA: Diagnosis not present

## 2019-11-19 DIAGNOSIS — E1169 Type 2 diabetes mellitus with other specified complication: Secondary | ICD-10-CM | POA: Diagnosis not present

## 2019-11-19 LAB — BASIC METABOLIC PANEL
BUN: 17 mg/dL (ref 6–23)
CO2: 30 mEq/L (ref 19–32)
Calcium: 9.3 mg/dL (ref 8.4–10.5)
Chloride: 100 mEq/L (ref 96–112)
Creatinine, Ser: 0.95 mg/dL (ref 0.40–1.50)
GFR: 78.75 mL/min (ref >60.00)
Glucose, Bld: 144 mg/dL — ABNORMAL HIGH (ref 70–99)
Potassium: 4.4 mEq/L (ref 3.5–5.1)
Sodium: 138 mEq/L (ref 135–145)

## 2019-11-19 LAB — HEPATIC FUNCTION PANEL
ALT: 14 U/L (ref 0–53)
AST: 16 U/L (ref 0–37)
Albumin: 4.1 g/dL (ref 3.5–5.2)
Alkaline Phosphatase: 95 U/L (ref 39–117)
Bilirubin, Direct: 0.1 mg/dL (ref 0.0–0.3)
Total Bilirubin: 0.5 mg/dL (ref 0.2–1.2)
Total Protein: 6.5 g/dL (ref 6.0–8.3)

## 2019-11-19 LAB — LIPID PANEL
Cholesterol: 120 mg/dL (ref 0–200)
HDL: 38.4 mg/dL — ABNORMAL LOW (ref >39.00)
LDL Cholesterol: 65 mg/dL (ref 0–99)
NonHDL: 81.27
Total CHOL/HDL Ratio: 3
Triglycerides: 83 mg/dL (ref 0.0–149.0)
VLDL: 16.6 mg/dL (ref 0.0–40.0)

## 2019-11-19 LAB — HEMOGLOBIN A1C: Hgb A1c MFr Bld: 7.5 % — ABNORMAL HIGH (ref 4.6–6.5)

## 2019-11-19 NOTE — Patient Instructions (Signed)
Follow up in 3-4 months to recheck diabetes We'll notify you of your lab results and make any changes if needed Continue to work on healthy diet and regular exercise- you can do it!!! Call with any questions or concerns Stay Safe!  Stay Healthy!! 

## 2019-11-19 NOTE — Progress Notes (Signed)
   Subjective:    Patient ID: George Christensen, male    DOB: [birth year], 69 y.o.   MRN: [id number]  HPI DM- chronic problem, on Glipizide XL10mg  daily.  Last A1C 8.1  UTD on eye exam, foot exam.  On ACE (lisinopril) for renal protection.  No CP, SOB, HAs, visual changes.  Denies symptomatic lows.  No numbness/tingling of hands/feet.  Hyperlipidemia- chronic problem, on Crestor 20mg  daily.  Last LDL 77.  No abd pain, N/V.  Morbid Obesity- pt is down 10 lbs since May.  BMI 53.76.  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  Safety protocols were in place, including screening questions prior to the visit, additional usage of staff PPE, and extensive cleaning of exam room while observing appropriate contact time as indicated for disinfecting solutions.       Objective:   Physical Exam        Assessment & Plan:

## 2019-11-19 NOTE — Assessment & Plan Note (Signed)
Pt is down 10 lbs since last visit.  BMI is 53.76.  Encouraged continued dietary efforts and regular exercise.  Will follow.

## 2019-11-19 NOTE — Assessment & Plan Note (Signed)
Chronic problem.  On Crestor 20mg  daily w/o difficulty.  Last LDL 77.  Check labs.  Adjust meds prn

## 2019-11-19 NOTE — Assessment & Plan Note (Signed)
Ongoing issue for pt.  Last A1C was poorly controlled at 8.  He has lost 10 lbs since then.  On Glipizide XL 10mg  daily.  UTD on eye exam, foot exam.  On ACE for renal protection.  Check labs.  Adjust meds prn

## 2019-11-20 ENCOUNTER — Encounter: Payer: Self-pay | Admitting: General Practice

## 2019-12-17 ENCOUNTER — Telehealth: Payer: Self-pay | Admitting: Family Medicine

## 2019-12-17 NOTE — Telephone Encounter (Signed)
Spoke with patient he stated to call back tomorrow morning

## 2020-01-01 ENCOUNTER — Other Ambulatory Visit: Payer: Self-pay | Admitting: Family Medicine

## 2020-01-06 ENCOUNTER — Other Ambulatory Visit: Payer: Self-pay | Admitting: Family Medicine

## 2020-02-05 DIAGNOSIS — R69 Illness, unspecified: Secondary | ICD-10-CM | POA: Diagnosis not present

## 2020-02-11 ENCOUNTER — Other Ambulatory Visit: Payer: Self-pay | Admitting: Family Medicine

## 2020-02-13 ENCOUNTER — Telehealth: Payer: Self-pay | Admitting: Family Medicine

## 2020-02-13 NOTE — Telephone Encounter (Signed)
Spoke with patient and he declined the AWV and do not want any calls

## 2020-03-20 ENCOUNTER — Ambulatory Visit: Payer: Medicare HMO | Admitting: Family Medicine

## 2020-04-02 ENCOUNTER — Other Ambulatory Visit: Payer: Self-pay | Admitting: Family Medicine

## 2020-04-06 ENCOUNTER — Other Ambulatory Visit: Payer: Self-pay

## 2020-04-06 ENCOUNTER — Encounter: Payer: Self-pay | Admitting: Family Medicine

## 2020-04-06 ENCOUNTER — Ambulatory Visit (INDEPENDENT_AMBULATORY_CARE_PROVIDER_SITE_OTHER): Payer: Medicare HMO | Admitting: Family Medicine

## 2020-04-06 VITALS — BP 138/80 | HR 75 | Temp 97.8°F | Resp 16 | Ht 67.0 in | Wt 344.0 lb

## 2020-04-06 DIAGNOSIS — E785 Hyperlipidemia, unspecified: Secondary | ICD-10-CM

## 2020-04-06 DIAGNOSIS — E119 Type 2 diabetes mellitus without complications: Secondary | ICD-10-CM

## 2020-04-06 DIAGNOSIS — E1169 Type 2 diabetes mellitus with other specified complication: Secondary | ICD-10-CM

## 2020-04-06 DIAGNOSIS — R197 Diarrhea, unspecified: Secondary | ICD-10-CM | POA: Insufficient documentation

## 2020-04-06 LAB — BASIC METABOLIC PANEL
BUN: 15 mg/dL (ref 6–23)
CO2: 28 mEq/L (ref 19–32)
Calcium: 9.4 mg/dL (ref 8.4–10.5)
Chloride: 101 mEq/L (ref 96–112)
Creatinine, Ser: 0.96 mg/dL (ref 0.40–1.50)
GFR: 81.07 mL/min (ref >60.00)
Glucose, Bld: 151 mg/dL — ABNORMAL HIGH (ref 70–99)
Potassium: 4.1 mEq/L (ref 3.5–5.1)
Sodium: 136 mEq/L (ref 135–145)

## 2020-04-06 LAB — LIPID PANEL
Cholesterol: 127 mg/dL (ref 0–200)
HDL: 37.9 mg/dL — ABNORMAL LOW (ref >39.00)
LDL Cholesterol: 70 mg/dL (ref 0–99)
NonHDL: 89.55
Total CHOL/HDL Ratio: 3
Triglycerides: 98 mg/dL (ref 0.0–149.0)
VLDL: 19.6 mg/dL (ref 0.0–40.0)

## 2020-04-06 LAB — TSH: TSH: 3.07 u[IU]/mL (ref 0.35–4.50)

## 2020-04-06 LAB — HEPATIC FUNCTION PANEL
ALT: 18 U/L (ref 0–53)
AST: 22 U/L (ref 0–37)
Albumin: 4 g/dL (ref 3.5–5.2)
Alkaline Phosphatase: 99 U/L (ref 39–117)
Bilirubin, Direct: 0.1 mg/dL (ref 0.0–0.3)
Total Bilirubin: 0.5 mg/dL (ref 0.2–1.2)
Total Protein: 6.9 g/dL (ref 6.0–8.3)

## 2020-04-06 LAB — CBC WITH DIFFERENTIAL/PLATELET
Basophils Absolute: 0.1 10*3/uL (ref 0.0–0.1)
Basophils Relative: 0.7 % (ref 0.0–3.0)
Eosinophils Absolute: 0.2 10*3/uL (ref 0.0–0.7)
Eosinophils Relative: 2.1 % (ref 0.0–5.0)
HCT: 41.2 % (ref 39.0–52.0)
Hemoglobin: 13.9 g/dL (ref 13.0–17.0)
Lymphocytes Relative: 20.7 % (ref 12.0–46.0)
Lymphs Abs: 2.1 10*3/uL (ref 0.7–4.0)
MCHC: 33.7 g/dL (ref 30.0–36.0)
MCV: 82.9 fl (ref 78.0–100.0)
Monocytes Absolute: 0.5 10*3/uL (ref 0.1–1.0)
Monocytes Relative: 4.6 % (ref 3.0–12.0)
Neutro Abs: 7.2 10*3/uL (ref 1.4–7.7)
Neutrophils Relative %: 71.9 % (ref 43.0–77.0)
Platelets: 233 10*3/uL (ref 150.0–400.0)
RBC: 4.97 Mil/uL (ref 4.22–5.81)
RDW: 13.9 % (ref 11.5–15.5)
WBC: 10 10*3/uL (ref 4.0–10.5)

## 2020-04-06 LAB — HEMOGLOBIN A1C: Hgb A1c MFr Bld: 7.7 % — ABNORMAL HIGH (ref 4.6–6.5)

## 2020-04-06 NOTE — Progress Notes (Signed)
   Subjective:    Patient ID: George Christensen, male    DOB: Apr 23, 1950, 70 y.o.   MRN: [id number]  HPI DM- chronic problem, on Glipizide 10mg  daily w/ hx of good control.  UTD on foot exam, eye exam.  On ACE for renal protection.  No CP, SOB, HAs, visual changes.  No symptomatic lows.  No numbness/tingling of hands/feet.  Hyperlipidemia- chronic problem.  Hx of good control on Crestor 20mg  daily.  Last LDL 65.  No abd pain, N/V.  Morbid obesity- pt's BMI is 53.88.  Ongoing issue for him.  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   Review of Systems For ROS see HPI   This visit occurred during the SARS-CoV-2 public health emergency.  Safety protocols were in place, including screening questions prior to the visit, additional usage of staff PPE, and extensive cleaning of exam room while observing appropriate contact time as indicated for disinfecting solutions.       Objective:   Physical Exam Vitals reviewed.  Constitutional:      General: He is not in acute distress.    Appearance: Normal appearance. He is well-developed and well-nourished. He is obese.  HENT:     Head: Normocephalic and atraumatic.  Eyes:     Extraocular Movements: EOM normal.     Conjunctiva/sclera: Conjunctivae normal.     Pupils: Pupils are equal, round, and reactive to light.  Neck:     Thyroid: No thyromegaly.  Cardiovascular:     Rate and Rhythm: Normal rate and regular rhythm.     Pulses: Normal pulses and intact distal pulses.     Heart sounds: Normal heart sounds. No murmur heard.   Pulmonary:     Effort: Pulmonary effort is normal. No respiratory distress.     Breath sounds: Normal breath sounds.  Abdominal:     General: Bowel sounds are normal. There is no distension.     Palpations: Abdomen is soft.  Musculoskeletal:        General: No edema.     Cervical back: Normal range of motion and neck supple.     Right lower leg: No edema.     Left lower leg:  No edema.  Lymphadenopathy:     Cervical: No cervical adenopathy.  Skin:    General: Skin is warm and dry.  Neurological:     Mental Status: He is alert and oriented to person, place, and time.     Cranial Nerves: No cranial nerve deficit.  Psychiatric:        Mood and Affect: Mood and affect normal.        Behavior: Behavior normal.           Assessment & Plan:

## 2020-04-06 NOTE — Patient Instructions (Addendum)
Schedule your complete physical for May We'll notify you of your lab results and make any changes if needed Continue to work on healthy diet and regular exercise- you can do it! Call with any questions or concerns Stay Safe!  Stay Healthy!

## 2020-04-06 NOTE — Assessment & Plan Note (Signed)
Ongoing issue for pt.  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  Encouraged low carb diet and regular physical activity.  Will follow.

## 2020-04-06 NOTE — Assessment & Plan Note (Signed)
Chronic problem.  Hx of good control on Glipizide.  UTD on foot exam, eye exam, and on ACE for renal protection.  He is not having symptomatic lows and reports feeling well.  Check labs.  Adjust meds prn

## 2020-04-06 NOTE — Assessment & Plan Note (Signed)
Chronic problem.  Tolerating statin w/o difficulty.  Check labs.  Adjust meds prn  

## 2020-04-13 ENCOUNTER — Other Ambulatory Visit: Payer: Self-pay | Admitting: Family Medicine

## 2020-06-26 ENCOUNTER — Other Ambulatory Visit: Payer: Self-pay | Admitting: Family Medicine

## 2020-06-27 ENCOUNTER — Other Ambulatory Visit: Payer: Self-pay | Admitting: Family Medicine

## 2020-07-08 ENCOUNTER — Encounter: Payer: Self-pay | Admitting: Family Medicine

## 2020-07-08 ENCOUNTER — Other Ambulatory Visit: Payer: Self-pay

## 2020-07-08 ENCOUNTER — Ambulatory Visit (INDEPENDENT_AMBULATORY_CARE_PROVIDER_SITE_OTHER): Payer: Medicare HMO | Admitting: Family Medicine

## 2020-07-08 VITALS — BP 122/70 | HR 83 | Temp 97.9°F | Ht 67.0 in | Wt 337.6 lb

## 2020-07-08 DIAGNOSIS — E119 Type 2 diabetes mellitus without complications: Secondary | ICD-10-CM

## 2020-07-08 DIAGNOSIS — Z125 Encounter for screening for malignant neoplasm of prostate: Secondary | ICD-10-CM

## 2020-07-08 DIAGNOSIS — Z0000 Encounter for general adult medical examination without abnormal findings: Secondary | ICD-10-CM | POA: Diagnosis not present

## 2020-07-08 LAB — HEPATIC FUNCTION PANEL
ALT: 17 U/L (ref 0–53)
AST: 19 U/L (ref 0–37)
Albumin: 4.4 g/dL (ref 3.5–5.2)
Alkaline Phosphatase: 101 U/L (ref 39–117)
Bilirubin, Direct: 0.1 mg/dL (ref 0.0–0.3)
Total Bilirubin: 0.5 mg/dL (ref 0.2–1.2)
Total Protein: 6.8 g/dL (ref 6.0–8.3)

## 2020-07-08 LAB — CBC WITH DIFFERENTIAL/PLATELET
Basophils Absolute: 0.1 10*3/uL (ref 0.0–0.1)
Basophils Relative: 0.8 % (ref 0.0–3.0)
Eosinophils Absolute: 0.2 10*3/uL (ref 0.0–0.7)
Eosinophils Relative: 2.7 % (ref 0.0–5.0)
HCT: 41.9 % (ref 39.0–52.0)
Hemoglobin: 14.1 g/dL (ref 13.0–17.0)
Lymphocytes Relative: 21.7 % (ref 12.0–46.0)
Lymphs Abs: 1.9 10*3/uL (ref 0.7–4.0)
MCHC: 33.8 g/dL (ref 30.0–36.0)
MCV: 84.7 fl (ref 78.0–100.0)
Monocytes Absolute: 0.5 10*3/uL (ref 0.1–1.0)
Monocytes Relative: 5.3 % (ref 3.0–12.0)
Neutro Abs: 5.9 10*3/uL (ref 1.4–7.7)
Neutrophils Relative %: 69.5 % (ref 43.0–77.0)
Platelets: 222 10*3/uL (ref 150.0–400.0)
RBC: 4.94 Mil/uL (ref 4.22–5.81)
RDW: 14.3 % (ref 11.5–15.5)
WBC: 8.5 10*3/uL (ref 4.0–10.5)

## 2020-07-08 LAB — BASIC METABOLIC PANEL
BUN: 22 mg/dL (ref 6–23)
CO2: 28 mEq/L (ref 19–32)
Calcium: 9.6 mg/dL (ref 8.4–10.5)
Chloride: 104 mEq/L (ref 96–112)
Creatinine, Ser: 0.97 mg/dL (ref 0.40–1.50)
GFR: 79.92 mL/min (ref >60.00)
Glucose, Bld: 148 mg/dL — ABNORMAL HIGH (ref 70–99)
Potassium: 4.2 mEq/L (ref 3.5–5.1)
Sodium: 141 mEq/L (ref 135–145)

## 2020-07-08 LAB — LIPID PANEL
Cholesterol: 116 mg/dL (ref 0–200)
HDL: 39.5 mg/dL (ref >39.00)
LDL Cholesterol: 60 mg/dL (ref 0–99)
NonHDL: 76.31
Total CHOL/HDL Ratio: 3
Triglycerides: 80 mg/dL (ref 0.0–149.0)
VLDL: 16 mg/dL (ref 0.0–40.0)

## 2020-07-08 LAB — PSA, MEDICARE: PSA: 1.84 ng/ml (ref 0.10–4.00)

## 2020-07-08 LAB — HEMOGLOBIN A1C: Hgb A1c MFr Bld: 7.5 % — ABNORMAL HIGH (ref 4.6–6.5)

## 2020-07-08 LAB — TSH: TSH: 2.96 u[IU]/mL (ref 0.35–4.50)

## 2020-07-08 NOTE — Progress Notes (Signed)
   Subjective:    Patient ID: George Christensen, male    DOB: [birth year], 70 y.o.   MRN: [id number]  HPI CPE- UTD on cologuard, eye exam, COVID vaccines.  Declines PNA shot  Reviewed past medical, surgical, family and social histories.   Patient Care Team    Relationship Specialty Notifications Start End  Midge Minium, MD PCP - General Family Medicine  07/18/16   Webb Laws, [place] Referring Physician Optometry  07/18/16     Health Maintenance  Topic Date Due  . Samul Dada  03/01/2019  . FOOT EXAM  07/04/2020  . Hepatitis C Screening  11/18/2020 (Originally 06/29/1969)  . PNA vac Low Risk Adult (1 of 2 - PCV13) 11/18/2020 (Originally 06/29/2016)  . INFLUENZA VACCINE  09/28/2020  . HEMOGLOBIN A1C  10/04/2020  . OPHTHALMOLOGY EXAM  10/28/2020  . Fecal DNA (Cologuard)  03/20/2021  . COVID-19 Vaccine  Completed  . HPV VACCINES  Aged Out      Review of Systems Patient reports no vision/hearing changes, anorexia, fever ,adenopathy, persistant/recurrent hoarseness, swallowing issues, chest pain, palpitations, edema, persistant/recurrent cough, hemoptysis, dyspnea (rest,exertional, paroxysmal nocturnal), gastrointestinal  bleeding (melena, rectal bleeding), abdominal pain, excessive heart burn, GU symptoms (dysuria, hematuria, voiding/incontinence issues) syncope, focal weakness, memory loss, numbness & tingling, skin/hair/nail changes, depression, anxiety, abnormal bruising/bleeding, musculoskeletal symptoms/signs.   This visit occurred during the SARS-CoV-2 public health emergency.  Safety protocols were in place, including screening questions prior to the visit, additional usage of staff PPE, and extensive cleaning of exam room while observing appropriate contact time as indicated for disinfecting solutions.       Objective:   Physical Exam General Appearance:    Alert, cooperative, no distress, appears stated age, obese  Head:    Normocephalic, without obvious abnormality,  atraumatic  Eyes:    PERRL, conjunctiva/corneas clear, EOM's intact, fundi    benign, both eyes       Ears:    Normal TM's and external ear canals, both ears  Nose:   Deferred due to COVID  Throat:   Neck:   Supple, symmetrical, trachea midline, no adenopathy;       thyroid:  No enlargement/tenderness/nodules  Back:     Symmetric, no curvature, ROM normal, no CVA tenderness  Lungs:     Clear to auscultation bilaterally, respirations unlabored  Chest wall:    No tenderness or deformity  Heart:    Regular rate and rhythm, S1 and S2 normal, no murmur, rub   or gallop  Abdomen:     Soft, non-tender, bowel sounds active all four quadrants,    no masses, no organomegaly  Genitalia:    deferred  Rectal:    Extremities:   Extremities normal, atraumatic, no cyanosis or edema  Pulses:   2+ and symmetric all extremities  Skin:   Skin color, texture, turgor normal, no rashes or lesions  Lymph nodes:   Cervical, supraclavicular, and axillary nodes normal  Neurologic:   CNII-XII intact. Normal strength, sensation and reflexes      throughout          Assessment & Plan:

## 2020-07-08 NOTE — Patient Instructions (Signed)
Follow up in 3-4 months to recheck diabetes We'll notify you of your lab results and make any changes if needed Continue to work on healthy diet and regular exercise- you can do it! Call with any questions or concerns Stay Safe!  Stay Healthy! Happy [place] Birthday!!

## 2020-07-08 NOTE — Assessment & Plan Note (Signed)
Pt's PE WNL w/ exception of obesity.  UTD on cologuard, eye exam, COVID vaccines.  Declines PNA shot.  Check labs.  Anticipatory guidance provided.

## 2020-07-08 NOTE — Assessment & Plan Note (Signed)
Chronic problem.  UTD on eye exam, on ACE for renal protection.  Foot exam done today.  Check labs.  Adjust meds prn  

## 2020-07-12 ENCOUNTER — Other Ambulatory Visit: Payer: Self-pay | Admitting: Family Medicine

## 2020-07-28 ENCOUNTER — Telehealth: Payer: Self-pay | Admitting: Family Medicine

## 2020-07-28 NOTE — Telephone Encounter (Signed)
Patient would like a copy of his latest labs mailed

## 2020-07-29 ENCOUNTER — Other Ambulatory Visit: Payer: Self-pay | Admitting: Family Medicine

## 2020-07-29 NOTE — Telephone Encounter (Signed)
Labs have been printed and mailed to patient address on file.

## 2020-08-05 ENCOUNTER — Other Ambulatory Visit: Payer: Self-pay | Admitting: Family Medicine

## 2020-08-14 ENCOUNTER — Telehealth: Payer: Self-pay | Admitting: Family Medicine

## 2020-08-14 NOTE — Chronic Care Management (AMB) (Signed)
  Chronic Care Management   Outreach Note  08/14/2020 Name: George Christensen MRN: [id number] DOB: 12/01/1950  Referred by: Midge Minium, MD Reason for referral : Chronic Care Management   An unsuccessful telephone outreach was attempted today. The patient was referred to the pharmacist for assistance with care management and care coordination.   Follow Up Plan:   Lauretta Grill [employer] Scheduler

## 2020-08-18 NOTE — Telephone Encounter (Signed)
Patient is not interested in speaking with the pharmacist

## 2020-10-04 ENCOUNTER — Other Ambulatory Visit: Payer: Self-pay | Admitting: Family Medicine

## 2020-11-11 ENCOUNTER — Other Ambulatory Visit: Payer: Self-pay

## 2020-11-11 ENCOUNTER — Encounter: Payer: Self-pay | Admitting: Family Medicine

## 2020-11-11 ENCOUNTER — Ambulatory Visit (INDEPENDENT_AMBULATORY_CARE_PROVIDER_SITE_OTHER): Payer: Medicare HMO | Admitting: Family Medicine

## 2020-11-11 VITALS — BP 126/80 | HR 86 | Temp 97.7°F | Resp 17 | Ht 67.0 in | Wt 338.8 lb

## 2020-11-11 DIAGNOSIS — I10 Essential (primary) hypertension: Secondary | ICD-10-CM | POA: Diagnosis not present

## 2020-11-11 DIAGNOSIS — E119 Type 2 diabetes mellitus without complications: Secondary | ICD-10-CM

## 2020-11-11 LAB — BASIC METABOLIC PANEL
BUN: 18 mg/dL (ref 6–23)
CO2: 27 mEq/L (ref 19–32)
Calcium: 9.4 mg/dL (ref 8.4–10.5)
Chloride: 101 mEq/L (ref 96–112)
Creatinine, Ser: 0.91 mg/dL (ref 0.40–1.50)
GFR: 86.08 mL/min (ref >60.00)
Glucose, Bld: 172 mg/dL — ABNORMAL HIGH (ref 70–99)
Potassium: 4.2 mEq/L (ref 3.5–5.1)
Sodium: 137 mEq/L (ref 135–145)

## 2020-11-11 LAB — HEMOGLOBIN A1C: Hgb A1c MFr Bld: 7.7 % — ABNORMAL HIGH (ref 4.6–6.5)

## 2020-11-11 NOTE — Assessment & Plan Note (Signed)
BP was initially elevated but came down to his usual reading prior to leaving the office.  No med changes at this time

## 2020-11-11 NOTE — Patient Instructions (Addendum)
Follow up in 3-4 months to recheck diabetes and blood pressure We'll notify you of your lab results and make any changes if needed Continue to work on healthy diet and regular exercise- you can do it! Schedule your eye exam at your convenience and have them send me a copy Call with any questions or concerns Hang in there!!!

## 2020-11-11 NOTE — Assessment & Plan Note (Signed)
Chronic problem.  UTD on foot exam.  On ACE for renal protection.  Due for eye exam- pt to schedule.  Tolerating Glipizide w/o difficulty.  Check labs.  Adjust meds prn

## 2020-11-11 NOTE — Progress Notes (Signed)
   Subjective:    Patient ID: George Christensen, male    DOB: August 08, 1950, 70 y.o.   MRN: [id number]  HPI DM- chronic problem.  On Glipizide '10mg'$  daily w/ hx of adequate control.  Last A1C 7.5%  Weight is stable.  On ACE for renal protection.  UTD on foot exam.  Due for eye exam- Dr Einar Gip closed his practice and moved to [place].  Denies symptomatic lows.  No numbness/tingling of hands/feet.  No abd pain, N/V.  HTN- BP is elevated today which is unusual for him.  On Amlodipine '5mg'$  daily, Lisinopril HCTZ 20/12.'5mg'$  daily.  No CP, SOB, HAs, visual changes, edema.  Pt is under considerable stress due to wife's current health issues.  Declines flu shot   Review of Systems For ROS see HPI   This visit occurred during the SARS-CoV-2 public health emergency.  Safety protocols were in place, including screening questions prior to the visit, additional usage of staff PPE, and extensive cleaning of exam room while observing appropriate contact time as indicated for disinfecting solutions.      Objective:   Physical Exam Vitals reviewed.  Constitutional:      General: He is not in acute distress.    Appearance: Normal appearance. He is well-developed. He is obese.  HENT:     Head: Normocephalic and atraumatic.  Eyes:     Extraocular Movements: Extraocular movements intact.     Conjunctiva/sclera: Conjunctivae normal.     Pupils: Pupils are equal, round, and reactive to light.  Neck:     Thyroid: No thyromegaly.  Cardiovascular:     Rate and Rhythm: Normal rate and regular rhythm.     Pulses: Normal pulses.     Heart sounds: Normal heart sounds. No murmur heard. Pulmonary:     Effort: Pulmonary effort is normal. No respiratory distress.     Breath sounds: Normal breath sounds.  Abdominal:     General: Bowel sounds are normal. There is no distension.     Palpations: Abdomen is soft.  Musculoskeletal:     Cervical back: Normal range of motion and neck supple.     Right lower leg: No  edema.     Left lower leg: No edema.  Lymphadenopathy:     Cervical: No cervical adenopathy.  Skin:    General: Skin is warm and dry.  Neurological:     General: No focal deficit present.     Mental Status: He is alert and oriented to person, place, and time.     Cranial Nerves: No cranial nerve deficit.  Psychiatric:        Mood and Affect: Mood normal.        Behavior: Behavior normal.          Assessment & Plan:

## 2021-01-03 ENCOUNTER — Other Ambulatory Visit: Payer: Self-pay | Admitting: Family Medicine

## 2021-01-20 ENCOUNTER — Other Ambulatory Visit: Payer: Self-pay | Admitting: Family Medicine

## 2021-01-28 ENCOUNTER — Other Ambulatory Visit: Payer: Self-pay | Admitting: Family Medicine

## 2021-03-05 ENCOUNTER — Other Ambulatory Visit: Payer: Self-pay | Admitting: Family Medicine

## 2021-03-17 ENCOUNTER — Ambulatory Visit (INDEPENDENT_AMBULATORY_CARE_PROVIDER_SITE_OTHER): Payer: Medicare HMO | Admitting: Family Medicine

## 2021-03-17 ENCOUNTER — Encounter: Payer: Self-pay | Admitting: Family Medicine

## 2021-03-17 VITALS — BP 128/82 | HR 77 | Temp 97.8°F | Resp 17 | Wt 339.0 lb

## 2021-03-17 DIAGNOSIS — Z1211 Encounter for screening for malignant neoplasm of colon: Secondary | ICD-10-CM | POA: Diagnosis not present

## 2021-03-17 DIAGNOSIS — E785 Hyperlipidemia, unspecified: Secondary | ICD-10-CM | POA: Diagnosis not present

## 2021-03-17 DIAGNOSIS — J069 Acute upper respiratory infection, unspecified: Secondary | ICD-10-CM

## 2021-03-17 DIAGNOSIS — I10 Essential (primary) hypertension: Secondary | ICD-10-CM | POA: Diagnosis not present

## 2021-03-17 DIAGNOSIS — R062 Wheezing: Secondary | ICD-10-CM | POA: Diagnosis not present

## 2021-03-17 DIAGNOSIS — E119 Type 2 diabetes mellitus without complications: Secondary | ICD-10-CM | POA: Diagnosis not present

## 2021-03-17 DIAGNOSIS — E1169 Type 2 diabetes mellitus with other specified complication: Secondary | ICD-10-CM

## 2021-03-17 LAB — HEPATIC FUNCTION PANEL
ALT: 16 U/L (ref 0–53)
AST: 16 U/L (ref 0–37)
Albumin: 3.9 g/dL (ref 3.5–5.2)
Alkaline Phosphatase: 103 U/L (ref 39–117)
Bilirubin, Direct: 0.1 mg/dL (ref 0.0–0.3)
Total Bilirubin: 0.4 mg/dL (ref 0.2–1.2)
Total Protein: 6.4 g/dL (ref 6.0–8.3)

## 2021-03-17 LAB — BASIC METABOLIC PANEL
BUN: 20 mg/dL (ref 6–23)
CO2: 27 mEq/L (ref 19–32)
Calcium: 9.2 mg/dL (ref 8.4–10.5)
Chloride: 100 mEq/L (ref 96–112)
Creatinine, Ser: 1.05 mg/dL (ref 0.40–1.50)
GFR: 72.32 mL/min (ref >60.00)
Glucose, Bld: 179 mg/dL — ABNORMAL HIGH (ref 70–99)
Potassium: 4.5 mEq/L (ref 3.5–5.1)
Sodium: 137 mEq/L (ref 135–145)

## 2021-03-17 LAB — LIPID PANEL
Cholesterol: 120 mg/dL (ref 0–200)
HDL: 39.3 mg/dL (ref >39.00)
LDL Cholesterol: 63 mg/dL (ref 0–99)
NonHDL: 81.08
Total CHOL/HDL Ratio: 3
Triglycerides: 89 mg/dL (ref 0.0–149.0)
VLDL: 17.8 mg/dL (ref 0.0–40.0)

## 2021-03-17 LAB — CBC WITH DIFFERENTIAL/PLATELET
Basophils Absolute: 0 10*3/uL (ref 0.0–0.1)
Basophils Relative: 0.6 % (ref 0.0–3.0)
Eosinophils Absolute: 0.3 10*3/uL (ref 0.0–0.7)
Eosinophils Relative: 3.1 % (ref 0.0–5.0)
HCT: 39.5 % (ref 39.0–52.0)
Hemoglobin: 13.1 g/dL (ref 13.0–17.0)
Lymphocytes Relative: 20.8 % (ref 12.0–46.0)
Lymphs Abs: 1.8 10*3/uL (ref 0.7–4.0)
MCHC: 33.1 g/dL (ref 30.0–36.0)
MCV: 83.5 fl (ref 78.0–100.0)
Monocytes Absolute: 0.5 10*3/uL (ref 0.1–1.0)
Monocytes Relative: 5.2 % (ref 3.0–12.0)
Neutro Abs: 6.2 10*3/uL (ref 1.4–7.7)
Neutrophils Relative %: 70.3 % (ref 43.0–77.0)
Platelets: 222 10*3/uL (ref 150.0–400.0)
RBC: 4.73 Mil/uL (ref 4.22–5.81)
RDW: 13.8 % (ref 11.5–15.5)
WBC: 8.8 10*3/uL (ref 4.0–10.5)

## 2021-03-17 LAB — HEMOGLOBIN A1C: Hgb A1c MFr Bld: 8.4 % — ABNORMAL HIGH (ref 4.6–6.5)

## 2021-03-17 LAB — TSH: TSH: 3.41 u[IU]/mL (ref 0.35–5.50)

## 2021-03-17 MED ORDER — ALBUTEROL SULFATE HFA 108 (90 BASE) MCG/ACT IN AERS: INHALATION_SPRAY | RESPIRATORY_TRACT | 1 refills | Status: DC

## 2021-03-17 NOTE — Progress Notes (Signed)
° °  Subjective:    Patient ID: George Christensen, male    DOB: 10-12-1950, 71 y.o.   MRN: [id number]  HPI HTN- chronic problem, on Amlodipine 5mg  daily, Lisinopril HCTZ 20/12.5mg  daily, and Terazosin 10mg  daily w/ good control.  No CP, SOB, HAs, visual changes, edema.  Hyperlipidemia- chronic problem, on Crestor 20mg  daily.  Denies abd pain, N/V.  DM- chronic problem, on Glipizide 10mg  daily.  On ACE for renal protection.  UTD on foot exam.  Due for eye exam.  Denies symptomatic lows.  No numbness/tingling of hands/feet  Chest congestion- pt reports he had increased nasal congestion on 1/6 which subsequently settled into his chest.  Took Mucinex DM x5 days.  Reports feeling better but continues to have intermittently productive cough w/ wheezing.  Obesity- pt's weight is stable.  BMI 53.09  Very limited physical activity due to weather.  Pt is about to start [place].  Will be doing plan w/ his wife.   Review of Systems For ROS see HPI   This visit occurred during the SARS-CoV-2 public health emergency.  Safety protocols were in place, including screening questions prior to the visit, additional usage of staff PPE, and extensive cleaning of exam room while observing appropriate contact time as indicated for disinfecting solutions.      Objective:   Physical Exam Vitals reviewed.  Constitutional:      General: He is not in acute distress.    Appearance: Normal appearance. He is well-developed. He is obese. He is not ill-appearing.  HENT:     Head: Normocephalic and atraumatic.  Eyes:     Extraocular Movements: Extraocular movements intact.     Conjunctiva/sclera: Conjunctivae normal.     Pupils: Pupils are equal, round, and reactive to light.  Neck:     Thyroid: No thyromegaly.  Cardiovascular:     Rate and Rhythm: Normal rate and regular rhythm.     Pulses: Normal pulses.     Heart sounds: Normal heart sounds. No murmur heard. Pulmonary:     Effort: Pulmonary effort is normal. No  respiratory distress.     Breath sounds: No stridor. Wheezing (Expiratory wheezes) present. No rhonchi or rales.     Comments: Decreased expiratory phase Abdominal:     General: Bowel sounds are normal. There is no distension.     Palpations: Abdomen is soft.  Musculoskeletal:     Cervical back: Normal range of motion and neck supple.     Right lower leg: No edema.     Left lower leg: No edema.  Lymphadenopathy:     Cervical: No cervical adenopathy.  Skin:    General: Skin is warm and dry.  Neurological:     General: No focal deficit present.     Mental Status: He is alert and oriented to person, place, and time.     Cranial Nerves: No cranial nerve deficit.  Psychiatric:        Mood and Affect: Mood normal.        Behavior: Behavior normal.          Assessment & Plan:  Wheezing- new.  Suspect post-viral wheeze as pt reports feeling well w/ exception of intermittent cough and nighttime wheeze.  Restart Albuterol for airway inflammation.  Reviewed supportive care and red flags that should prompt return.  Pt expressed understanding and is in agreement w/ plan.

## 2021-03-17 NOTE — Assessment & Plan Note (Signed)
Chronic problem.  Well controlled today on Amlodipine, Lisinopril, Terazosin.  Currently asymptomatic.  Check labs due to ACE but no anticipated med changes.

## 2021-03-17 NOTE — Assessment & Plan Note (Signed)
Chronic problem.  Currently on Glipizide.  Reports sugars have been running high w/ the holidays.  On ACE for renal protection.  UTD on foot exam.  Referral placed for eye exam.  Encouraged healthy, low carb diet and regular physical activity.  Check labs.  Adjust meds prn

## 2021-03-17 NOTE — Assessment & Plan Note (Signed)
Chronic problem.  Tolerating Crestor w/o difficulty.  Check labs.  Adjust meds prn  

## 2021-03-17 NOTE — Patient Instructions (Signed)
Follow up in 3-4 months to recheck diabetes We'll notify you of your lab results and make any changes if needed Continue to work on healthy diet and regular exercise- you can do it! We'll call you with your eye appt Complete the cologuard and return as directed USE the Albuterol inhaler as needed for cough and wheeze Call with any questions or concerns Stay Safe!  Stay Healthy!! Happy New Year!!

## 2021-03-17 NOTE — Assessment & Plan Note (Signed)
Ongoing issue for pt.  Is going to start [place] w/ his wife in attempts to lose weight.  Will follow.

## 2021-03-18 ENCOUNTER — Other Ambulatory Visit: Payer: Self-pay | Admitting: Family Medicine

## 2021-03-18 MED ORDER — TRULICITY 0.75 MG/0.5ML ~~LOC~~ SOAJ: 0.7500 mg | SUBCUTANEOUS | 3 refills | Status: DC

## 2021-03-19 ENCOUNTER — Telehealth: Payer: Self-pay

## 2021-03-19 NOTE — Telephone Encounter (Signed)
-----   Message from Midge Minium, MD sent at 03/18/2021  8:29 PM EST ----- A1C has jumped from 7.7 --> 8.4%  This indicates poor sugar control.  Based on this, we should start once weekly injectable Trulicity to help get sugar under control as well as assist w/ weight loss.  Will send prescription to pharmacy for him to pick up.

## 2021-03-19 NOTE — Telephone Encounter (Signed)
Pt to be advised of lab results and recommended medication change

## 2021-03-23 NOTE — Telephone Encounter (Signed)
LM again.

## 2021-03-24 ENCOUNTER — Telehealth: Payer: Self-pay

## 2021-03-24 NOTE — Telephone Encounter (Signed)
Caller name:George Christensen   On DPR? :Yes  Call back number: [phone] or land line [phone]  Provider they see: Birdie Riddle   Reason for call:Pt wants a call back regarding lab work

## 2021-03-24 NOTE — Telephone Encounter (Signed)
Letter has been sent

## 2021-03-24 NOTE — Telephone Encounter (Signed)
Lm again, third call no answer should I send letter?

## 2021-03-24 NOTE — Telephone Encounter (Signed)
Please do.  That way it is documented that we've tried all avenues to contact him

## 2021-03-24 NOTE — Telephone Encounter (Signed)
Called pt back and was finally able to relay info, pt has declined medication at this time reports wants to try diet first

## 2021-03-31 DIAGNOSIS — Z1211 Encounter for screening for malignant neoplasm of colon: Secondary | ICD-10-CM | POA: Diagnosis not present

## 2021-04-01 ENCOUNTER — Telehealth: Payer: Self-pay

## 2021-04-01 NOTE — Telephone Encounter (Signed)
Caller name:Solomon Tobin Chad   On DPR? :Yes  Call back number:[phone]  Provider they see: Birdie Riddle   Reason for call:Pt has started diet and does not feel that he needs to take medication that was sent to the pharmacy until he comes back in May feels it will be to much for his body to take on and wants Dr Birdie Riddle to know this.

## 2021-04-02 ENCOUNTER — Other Ambulatory Visit: Payer: Self-pay | Admitting: Family Medicine

## 2021-04-08 LAB — COLOGUARD: COLOGUARD: NEGATIVE

## 2021-04-09 ENCOUNTER — Telehealth: Payer: Self-pay

## 2021-04-09 NOTE — Telephone Encounter (Signed)
Lab results: A1C has jumped from 7.7 --> 8.4%  This indicates poor sugar control.  Based on this, we should start once weekly injectable Trulicity to help get sugar under control as well as assist w/ weight loss.  Will send prescription to pharmacy for him to pick up.

## 2021-04-09 NOTE — Telephone Encounter (Signed)
-----   Message from Midge Minium, MD sent at 04/08/2021  3:29 PM EST ----- Negative cologuard- good news!!!

## 2021-04-11 ENCOUNTER — Other Ambulatory Visit: Payer: Self-pay | Admitting: Family Medicine

## 2021-04-12 NOTE — Telephone Encounter (Signed)
Patient is aware of labs and called in to let Dr Birdie Riddle know he did not feel like he needed to start medication. That message was sent to Dr Birdie Riddle for [facility]. Patient follows up in May

## 2021-04-17 ENCOUNTER — Other Ambulatory Visit: Payer: Self-pay | Admitting: Family Medicine

## 2021-05-06 DIAGNOSIS — D3131 Benign neoplasm of right choroid: Secondary | ICD-10-CM | POA: Diagnosis not present

## 2021-05-06 DIAGNOSIS — H2513 Age-related nuclear cataract, bilateral: Secondary | ICD-10-CM | POA: Diagnosis not present

## 2021-05-06 DIAGNOSIS — Z7984 Long term (current) use of oral hypoglycemic drugs: Secondary | ICD-10-CM | POA: Diagnosis not present

## 2021-05-06 DIAGNOSIS — E119 Type 2 diabetes mellitus without complications: Secondary | ICD-10-CM | POA: Diagnosis not present

## 2021-05-06 LAB — HM DIABETES EYE EXAM

## 2021-07-02 ENCOUNTER — Encounter: Payer: Self-pay | Admitting: Family Medicine

## 2021-07-09 ENCOUNTER — Encounter: Payer: Self-pay | Admitting: Family Medicine

## 2021-07-09 ENCOUNTER — Ambulatory Visit (INDEPENDENT_AMBULATORY_CARE_PROVIDER_SITE_OTHER): Payer: Medicare HMO | Admitting: Family Medicine

## 2021-07-09 VITALS — BP 128/60 | HR 75 | Temp 97.3°F | Resp 17 | Ht 67.0 in | Wt 292.4 lb

## 2021-07-09 DIAGNOSIS — E119 Type 2 diabetes mellitus without complications: Secondary | ICD-10-CM | POA: Diagnosis not present

## 2021-07-09 DIAGNOSIS — E785 Hyperlipidemia, unspecified: Secondary | ICD-10-CM | POA: Diagnosis not present

## 2021-07-09 DIAGNOSIS — E1169 Type 2 diabetes mellitus with other specified complication: Secondary | ICD-10-CM | POA: Diagnosis not present

## 2021-07-09 DIAGNOSIS — I10 Essential (primary) hypertension: Secondary | ICD-10-CM | POA: Diagnosis not present

## 2021-07-09 DIAGNOSIS — Z6841 Body Mass Index (BMI) 40.0 and over, adult: Secondary | ICD-10-CM

## 2021-07-09 LAB — CBC WITH DIFFERENTIAL/PLATELET
Basophils Absolute: 0.1 10*3/uL (ref 0.0–0.1)
Basophils Relative: 0.7 % (ref 0.0–3.0)
Eosinophils Absolute: 0.2 10*3/uL (ref 0.0–0.7)
Eosinophils Relative: 2.1 % (ref 0.0–5.0)
HCT: 40.3 % (ref 39.0–52.0)
Hemoglobin: 13.4 g/dL (ref 13.0–17.0)
Lymphocytes Relative: 24.1 % (ref 12.0–46.0)
Lymphs Abs: 1.8 10*3/uL (ref 0.7–4.0)
MCHC: 33.2 g/dL (ref 30.0–36.0)
MCV: 86.4 fl (ref 78.0–100.0)
Monocytes Absolute: 0.5 10*3/uL (ref 0.1–1.0)
Monocytes Relative: 6.6 % (ref 3.0–12.0)
Neutro Abs: 4.9 10*3/uL (ref 1.4–7.7)
Neutrophils Relative %: 66.5 % (ref 43.0–77.0)
Platelets: 217 10*3/uL (ref 150.0–400.0)
RBC: 4.67 Mil/uL (ref 4.22–5.81)
RDW: 14.2 % (ref 11.5–15.5)
WBC: 7.4 10*3/uL (ref 4.0–10.5)

## 2021-07-09 LAB — BASIC METABOLIC PANEL
BUN: 24 mg/dL — ABNORMAL HIGH (ref 6–23)
CO2: 26 mEq/L (ref 19–32)
Calcium: 9.3 mg/dL (ref 8.4–10.5)
Chloride: 101 mEq/L (ref 96–112)
Creatinine, Ser: 1.01 mg/dL (ref 0.40–1.50)
GFR: 75.61 mL/min (ref >60.00)
Glucose, Bld: 120 mg/dL — ABNORMAL HIGH (ref 70–99)
Potassium: 4.4 mEq/L (ref 3.5–5.1)
Sodium: 136 mEq/L (ref 135–145)

## 2021-07-09 LAB — B12 AND FOLATE PANEL
Folate: >23.5 ng/mL (ref >5.9)
Vitamin B-12: 689 pg/mL (ref 211–911)

## 2021-07-09 LAB — HEPATIC FUNCTION PANEL
ALT: 18 U/L (ref 0–53)
AST: 21 U/L (ref 0–37)
Albumin: 4 g/dL (ref 3.5–5.2)
Alkaline Phosphatase: 89 U/L (ref 39–117)
Bilirubin, Direct: 0.1 mg/dL (ref 0.0–0.3)
Total Bilirubin: 0.5 mg/dL (ref 0.2–1.2)
Total Protein: 6.3 g/dL (ref 6.0–8.3)

## 2021-07-09 LAB — TSH: TSH: 2.94 u[IU]/mL (ref 0.35–5.50)

## 2021-07-09 LAB — VITAMIN D 25 HYDROXY (VIT D DEFICIENCY, FRACTURES): VITD: 31.41 ng/mL (ref 30.00–100.00)

## 2021-07-09 LAB — MICROALBUMIN / CREATININE URINE RATIO
Creatinine,U: 31.5 mg/dL
Microalb Creat Ratio: 2.2 mg/g (ref 0.0–30.0)
Microalb, Ur: <0.7 mg/dL (ref 0.0–1.9)

## 2021-07-09 LAB — LIPID PANEL
Cholesterol: 101 mg/dL (ref 0–200)
HDL: 33.6 mg/dL — ABNORMAL LOW (ref >39.00)
LDL Cholesterol: 53 mg/dL (ref 0–99)
NonHDL: 67.69
Total CHOL/HDL Ratio: 3
Triglycerides: 71 mg/dL (ref 0.0–149.0)
VLDL: 14.2 mg/dL (ref 0.0–40.0)

## 2021-07-09 LAB — HEMOGLOBIN A1C: Hgb A1c MFr Bld: 5.7 % (ref 4.6–6.5)

## 2021-07-09 NOTE — Progress Notes (Signed)
? ?  Subjective:  ? ? Patient ID: George Christensen, male    DOB: 03-May-1950, 71 y.o.   MRN: [id number] ? ?HPI ?DM- chronic problem, on Trulicity 0.'75mg'$  weekly, Glipizide XL '10mg'$  daily.  Last A1C 8.4%  UTD on eye exam.  Due for foot exam.  Needs microalbumin despite being on ACE for renal protection.  As his diet has changed he has had some symptomatic lows.  No numbness/tingling of hands/feet. ? ?Obesity- chronic problem.  Pt is down 47 lbs since January.  Doing [employer] and is on Trulicity.   ? ?HTN- pt is interested in decreasing or stopping BP medication.  Currently on Amlodipine '5mg'$  daily and Lisinopril HCTZ 20/12.'5mg'$  daily.  No CP, SOB, HAs, visual changes, edema. ? ?Hyperlipidemia- chronic problem, on Crestor '20mg'$  daily.  Pt is hopeful that blood work will change due to his recent weight loss.  No abd pain, N/V. ? ? ?Review of Systems ?For ROS see HPI  ?   ?Objective:  ? Physical Exam ?Vitals reviewed.  ?Constitutional:   ?   General: He is not in acute distress. ?   Appearance: Normal appearance. He is well-developed. He is obese. He is not ill-appearing.  ?HENT:  ?   Head: Normocephalic and atraumatic.  ?Eyes:  ?   Extraocular Movements: Extraocular movements intact.  ?   Conjunctiva/sclera: Conjunctivae normal.  ?   Pupils: Pupils are equal, round, and reactive to light.  ?Neck:  ?   Thyroid: No thyromegaly.  ?Cardiovascular:  ?   Rate and Rhythm: Normal rate and regular rhythm.  ?   Pulses: Normal pulses.  ?   Heart sounds: Normal heart sounds. No murmur heard. ?Pulmonary:  ?   Effort: Pulmonary effort is normal. No respiratory distress.  ?   Breath sounds: Normal breath sounds.  ?Abdominal:  ?   General: Bowel sounds are normal. There is no distension.  ?   Palpations: Abdomen is soft.  ?Musculoskeletal:  ?   Cervical back: Normal range of motion and neck supple.  ?   Right lower leg: No edema.  ?   Left lower leg: No edema.  ?Lymphadenopathy:  ?   Cervical: No cervical adenopathy.  ?Skin: ?   General: Skin is  warm and dry.  ?Neurological:  ?   General: No focal deficit present.  ?   Mental Status: He is alert and oriented to person, place, and time.  ?   Cranial Nerves: No cranial nerve deficit.  ?Psychiatric:     ?   Mood and Affect: Mood normal.     ?   Behavior: Behavior normal.  ? ? ? ? ? ?   ?Assessment & Plan:  ? ? ?

## 2021-07-09 NOTE — Assessment & Plan Note (Signed)
Pt is down 47 lbs since his visit in January!  He is doing the [place] program and on Trulicity.  Applauded his weight loss efforts.  He is excited and hopeful that we can decrease or eliminate some of his current meds.  Will continue to follow. ?

## 2021-07-09 NOTE — Patient Instructions (Addendum)
Schedule your complete physical in 3-4 months ?We'll notify you of your lab results and make any changes if needed ?Keep up the good work on healthy diet and regular exercise- you're doing great!! ?Call with any questions or concerns ?Stay Safe!  Stay Healthy! ?Have a great summer!!! ?

## 2021-07-09 NOTE — Assessment & Plan Note (Signed)
Chronic problem.  Currently on Crestor '20mg'$  daily.  Pt is very interested to see his lipid panel after losing 47 lbs.  Check labs.  Adjust meds prn  ?

## 2021-07-09 NOTE — Assessment & Plan Note (Signed)
Chronic problem.  Last A1C was elevated at 8.4%.  Currently on Trulicity and Glipizide XL.  Has lost 47 lbs!  Suspect we'll be able to stop his Glipizide to avoid symptomatic lows but will wait on labs to make adjustments.  UTD on eye exam.  Foot exam done today.  Microalbumin ordered. ?

## 2021-07-09 NOTE — Assessment & Plan Note (Signed)
Chronic problem.  Currently on Amlodipine '5mg'$  daily and Lisinopril HCTZ 20/12.'5mg'$  daily.  Open to stopping Amlodipine as pt continues to lose weight.  He wants to decide after labs are available for review. ?

## 2021-07-12 ENCOUNTER — Telehealth: Payer: Self-pay

## 2021-07-12 NOTE — Telephone Encounter (Signed)
-----   Message from Midge Minium, MD sent at 07/12/2021  7:47 AM EDT ----- ?A1C has dropped from 8.4 --> 5.7!!!  This is FANTASTIC!!!  Based on this, you can stop the Glipizide. ? ?Your Vit D level is at the low end of normal.  Please add a daily OTC supplement ? ?Remainder of labs look great!! ?

## 2021-07-12 NOTE — Telephone Encounter (Signed)
Spoke w/ pt and advised of labs .  ?

## 2021-07-15 ENCOUNTER — Other Ambulatory Visit: Payer: Self-pay | Admitting: Family Medicine

## 2021-08-30 ENCOUNTER — Other Ambulatory Visit: Payer: Self-pay | Admitting: Family Medicine

## 2021-09-13 ENCOUNTER — Encounter: Payer: Self-pay | Admitting: Family Medicine

## 2021-09-30 ENCOUNTER — Other Ambulatory Visit: Payer: Self-pay | Admitting: Family Medicine

## 2021-10-01 ENCOUNTER — Other Ambulatory Visit: Payer: Self-pay | Admitting: Family Medicine

## 2021-11-17 ENCOUNTER — Other Ambulatory Visit: Payer: Self-pay

## 2021-11-17 ENCOUNTER — Ambulatory Visit (INDEPENDENT_AMBULATORY_CARE_PROVIDER_SITE_OTHER): Payer: Medicare HMO | Admitting: Family Medicine

## 2021-11-17 ENCOUNTER — Encounter: Payer: Self-pay | Admitting: Family Medicine

## 2021-11-17 VITALS — BP 124/68 | HR 60 | Temp 98.7°F | Resp 18 | Ht 67.0 in | Wt 269.0 lb

## 2021-11-17 DIAGNOSIS — Z125 Encounter for screening for malignant neoplasm of prostate: Secondary | ICD-10-CM

## 2021-11-17 DIAGNOSIS — Z0000 Encounter for general adult medical examination without abnormal findings: Secondary | ICD-10-CM | POA: Diagnosis not present

## 2021-11-17 DIAGNOSIS — E119 Type 2 diabetes mellitus without complications: Secondary | ICD-10-CM

## 2021-11-17 LAB — CBC WITH DIFFERENTIAL/PLATELET
Basophils Absolute: 0 10*3/uL (ref 0.0–0.1)
Basophils Relative: 0.5 % (ref 0.0–3.0)
Eosinophils Absolute: 0.2 10*3/uL (ref 0.0–0.7)
Eosinophils Relative: 2.5 % (ref 0.0–5.0)
HCT: 42.5 % (ref 39.0–52.0)
Hemoglobin: 14.2 g/dL (ref 13.0–17.0)
Lymphocytes Relative: 21.8 % (ref 12.0–46.0)
Lymphs Abs: 1.5 10*3/uL (ref 0.7–4.0)
MCHC: 33.5 g/dL (ref 30.0–36.0)
MCV: 84.4 fl (ref 78.0–100.0)
Monocytes Absolute: 0.4 10*3/uL (ref 0.1–1.0)
Monocytes Relative: 5.6 % (ref 3.0–12.0)
Neutro Abs: 4.8 10*3/uL (ref 1.4–7.7)
Neutrophils Relative %: 69.6 % (ref 43.0–77.0)
Platelets: 177 10*3/uL (ref 150.0–400.0)
RBC: 5.04 Mil/uL (ref 4.22–5.81)
RDW: 15.2 % (ref 11.5–15.5)
WBC: 6.9 10*3/uL (ref 4.0–10.5)

## 2021-11-17 LAB — LIPID PANEL
Cholesterol: 122 mg/dL (ref 0–200)
HDL: 37.9 mg/dL — ABNORMAL LOW (ref >39.00)
LDL Cholesterol: 73 mg/dL (ref 0–99)
NonHDL: 84.1
Total CHOL/HDL Ratio: 3
Triglycerides: 56 mg/dL (ref 0.0–149.0)
VLDL: 11.2 mg/dL (ref 0.0–40.0)

## 2021-11-17 LAB — HEMOGLOBIN A1C: Hgb A1c MFr Bld: 6.7 % — ABNORMAL HIGH (ref 4.6–6.5)

## 2021-11-17 LAB — PSA, MEDICARE: PSA: 2.55 ng/ml (ref 0.10–4.00)

## 2021-11-17 LAB — BASIC METABOLIC PANEL
BUN: 22 mg/dL (ref 6–23)
CO2: 25 mEq/L (ref 19–32)
Calcium: 9.2 mg/dL (ref 8.4–10.5)
Chloride: 103 mEq/L (ref 96–112)
Creatinine, Ser: 0.9 mg/dL (ref 0.40–1.50)
GFR: 86.61 mL/min (ref >60.00)
Glucose, Bld: 122 mg/dL — ABNORMAL HIGH (ref 70–99)
Potassium: 4.6 mEq/L (ref 3.5–5.1)
Sodium: 138 mEq/L (ref 135–145)

## 2021-11-17 LAB — HEPATIC FUNCTION PANEL
ALT: 15 U/L (ref 0–53)
AST: 18 U/L (ref 0–37)
Albumin: 3.8 g/dL (ref 3.5–5.2)
Alkaline Phosphatase: 102 U/L (ref 39–117)
Bilirubin, Direct: 0.1 mg/dL (ref 0.0–0.3)
Total Bilirubin: 0.5 mg/dL (ref 0.2–1.2)
Total Protein: 6.2 g/dL (ref 6.0–8.3)

## 2021-11-17 LAB — TSH: TSH: 4.04 u[IU]/mL (ref 0.35–5.50)

## 2021-11-17 NOTE — Assessment & Plan Note (Signed)
Pt's PE WNL w/ exception of obesity.  UTD on cologuard.  Declines immunizations.  Check labs.  Anticipatory guidance provided.

## 2021-11-17 NOTE — Assessment & Plan Note (Signed)
Pt is down 23 lbs since last visit.  Applauded his efforts and encouraged him to continue.  Will follow.

## 2021-11-17 NOTE — Progress Notes (Signed)
   Subjective:    Patient ID: George Christensen, male    DOB: 02-25-1951, 71 y.o.   MRN: [id number]  HPI CPE- UTD on cologuard, foot exam, microalbumin, eye exam.  Pt declines immunizations.  Pt reports feeling good today.  No concerns  Patient Care Team    Relationship Specialty Notifications Start End  Midge Minium, MD PCP - General Family Medicine  07/18/16   Webb Laws, [place] Referring Physician Optometry  07/18/16     Health Maintenance  Topic Date Due   COVID-19 Vaccine (5 - Pfizer series) 12/03/2021 (Originally 03/15/2021)   Zoster Vaccines- Shingrix (1 of 2) 02/16/2022 (Originally 06/29/2001)   INFLUENZA VACCINE  05/29/2022 (Originally 09/28/2021)   Pneumonia Vaccine 51+ Years old (1 - PCV) 07/10/2022 (Originally 06/29/2016)   TETANUS/TDAP  11/18/2022 (Originally 03/01/2019)   HEMOGLOBIN A1C  01/09/2022   OPHTHALMOLOGY EXAM  05/07/2022   Diabetic kidney evaluation - GFR measurement  07/10/2022   Diabetic kidney evaluation - Urine ACR  07/10/2022   FOOT EXAM  07/10/2022   Fecal DNA (Cologuard)  03/31/2024   HPV VACCINES  Aged Out   Hepatitis C Screening  Discontinued      Review of Systems Patient reports no vision/hearing changes, anorexia, fever ,adenopathy, persistant/recurrent hoarseness, swallowing issues, chest pain, palpitations, edema, persistant/recurrent cough, hemoptysis, dyspnea (rest,exertional, paroxysmal nocturnal), gastrointestinal  bleeding (melena, rectal bleeding), abdominal pain, excessive heart burn, GU symptoms (dysuria, hematuria, voiding/incontinence issues) syncope, focal weakness, memory loss, numbness & tingling, skin/hair/nail changes, depression, anxiety, abnormal bruising/bleeding, musculoskeletal symptoms/signs.   + 23 lb weight loss    Objective:   Physical Exam General Appearance:    Alert, cooperative, no distress, appears stated age, obese  Head:    Normocephalic, without obvious abnormality, atraumatic  Eyes:    PERRL, conjunctiva/corneas  clear, EOM's intact both eyes       Ears:    Normal TM's and external ear canals, both ears  Nose:   Nares normal, septum midline, mucosa normal, no drainage   or sinus tenderness  Throat:   Lips, mucosa, and tongue normal; teeth and gums normal  Neck:   Supple, symmetrical, trachea midline, no adenopathy;       thyroid:  No enlargement/tenderness/nodules  Back:     Symmetric, no curvature, ROM normal, no CVA tenderness  Lungs:     Clear to auscultation bilaterally, respirations unlabored  Chest wall:    No tenderness or deformity  Heart:    Regular rate and rhythm, S1 and S2 normal, no murmur, rub   or gallop  Abdomen:     Soft, non-tender, bowel sounds active all four quadrants,    no masses, no organomegaly.  obese  Genitalia:    deferred  Rectal:    Extremities:   Extremities normal, atraumatic, no cyanosis or edema  Pulses:   2+ and symmetric all extremities  Skin:   Skin color, texture, turgor normal, no rashes or lesions  Lymph nodes:   Cervical, supraclavicular, and axillary nodes normal  Neurologic:   CNII-XII intact. Normal strength, sensation and reflexes      throughout          Assessment & Plan:

## 2021-11-17 NOTE — Progress Notes (Signed)
Pt seen results Via my chart  

## 2021-11-17 NOTE — Patient Instructions (Signed)
Follow up in 3-4 months to recheck sugars We'll notify you of your lab results and make any changes if needed Continue to work on low carb/low sugar diet and regular exercise- you're doing great!  You're down 23 lbs!!! Call with any questions or concerns Stay Safe!  Stay Healthy! Happy Fall!!!

## 2021-11-17 NOTE — Assessment & Plan Note (Signed)
Chronic problem.  UTD on eye exam, foot exam, microalbumin.  Is down 23 lbs since last visit.  Check labs.  Adjust meds prn

## 2021-12-08 ENCOUNTER — Other Ambulatory Visit: Payer: Self-pay | Admitting: Family Medicine

## 2022-01-02 ENCOUNTER — Other Ambulatory Visit: Payer: Self-pay | Admitting: Family Medicine

## 2022-01-26 ENCOUNTER — Other Ambulatory Visit: Payer: Self-pay | Admitting: Family Medicine

## 2022-02-27 ENCOUNTER — Other Ambulatory Visit: Payer: Self-pay | Admitting: Family Medicine

## 2022-03-01 ENCOUNTER — Other Ambulatory Visit: Payer: Self-pay

## 2022-03-01 MED ORDER — LISINOPRIL-HYDROCHLOROTHIAZIDE 20-12.5 MG PO TABS: 1.0000 | ORAL_TABLET | Freq: Every day | ORAL | 1 refills | Status: DC

## 2022-03-01 MED ORDER — LEVOTHYROXINE SODIUM 50 MCG PO TABS: 50.0000 ug | ORAL_TABLET | Freq: Every day | ORAL | 1 refills | Status: DC

## 2022-03-29 ENCOUNTER — Encounter: Payer: Self-pay | Admitting: Family Medicine

## 2022-03-29 ENCOUNTER — Ambulatory Visit (INDEPENDENT_AMBULATORY_CARE_PROVIDER_SITE_OTHER): Payer: Medicare HMO | Admitting: Family Medicine

## 2022-03-29 VITALS — BP 130/80 | HR 72 | Temp 97.7°F | Resp 17 | Ht 67.0 in | Wt 281.1 lb

## 2022-03-29 DIAGNOSIS — E119 Type 2 diabetes mellitus without complications: Secondary | ICD-10-CM | POA: Diagnosis not present

## 2022-03-29 NOTE — Assessment & Plan Note (Signed)
Deteriorated.  Pt has gained 12 lbs since last visit.  Stressed the need for low carb/low sugar diet and regular physical activity.  Will continue to follow.

## 2022-03-29 NOTE — Patient Instructions (Addendum)
Schedule a lab visit to return at your convenience (for the blood work) Follow up in 3-4 months to recheck sugar, BP, and cholesterol We'll notify you of your lab results and make any changes if needed Continue to work on low carb/low sugar diet and regular exercise Call with any questions or concerns Stay Safe!  Stay Healthy!

## 2022-03-29 NOTE — Progress Notes (Signed)
   Subjective:    Patient ID: George Christensen, male    DOB: Apr 25, 1950, 71 y.o.   MRN: [id number]  HPI DM- chronic problem.  Currently on Glipizide '10mg'$  daily.  On ACE for renal protection.  UTD on eye exam, foot exam, microalbumin.  Pt has gained 12 lbs since last visit.  'i'm doing pretty good'.  No CP, SOB, HA's visual changes, abd pain, N/V.  Denies symptomatic lows.  No numbness/tingling of hands/feet.  Obesity- pt reports doing [employer] diet plan but was lax over the holidays.  Has gained 12 lbs.  Review of Systems For ROS see HPI     Objective:   Physical Exam Vitals reviewed.  Constitutional:      General: He is not in acute distress.    Appearance: He is well-developed.  HENT:     Head: Normocephalic and atraumatic.  Eyes:     Extraocular Movements: Extraocular movements intact.     Conjunctiva/sclera: Conjunctivae normal.     Pupils: Pupils are equal, round, and reactive to light.  Neck:     Thyroid: No thyromegaly.  Cardiovascular:     Rate and Rhythm: Normal rate and regular rhythm.     Pulses: Normal pulses.     Heart sounds: Normal heart sounds. No murmur heard. Pulmonary:     Effort: Pulmonary effort is normal. No respiratory distress.     Breath sounds: Normal breath sounds.  Abdominal:     General: Bowel sounds are normal. There is no distension.     Palpations: Abdomen is soft.  Musculoskeletal:     Cervical back: Normal range of motion and neck supple.     Right lower leg: No edema.     Left lower leg: No edema.  Lymphadenopathy:     Cervical: No cervical adenopathy.  Skin:    General: Skin is warm and dry.  Neurological:     General: No focal deficit present.     Mental Status: He is alert and oriented to person, place, and time.     Cranial Nerves: No cranial nerve deficit.  Psychiatric:        Mood and Affect: Mood normal.        Behavior: Behavior normal.           Assessment & Plan:

## 2022-03-29 NOTE — Assessment & Plan Note (Signed)
Chronic problem.  On Glipizide '10mg'$  daily w/o difficulty.  On ACE for renal protection and UTD on microalbumin.  UTD on eye exam, foot exam.  Check labs.  Adjust meds prn

## 2022-03-30 ENCOUNTER — Other Ambulatory Visit: Payer: Self-pay | Admitting: Family Medicine

## 2022-03-30 ENCOUNTER — Other Ambulatory Visit (INDEPENDENT_AMBULATORY_CARE_PROVIDER_SITE_OTHER): Payer: Medicare HMO

## 2022-03-30 DIAGNOSIS — E119 Type 2 diabetes mellitus without complications: Secondary | ICD-10-CM | POA: Diagnosis not present

## 2022-03-30 LAB — BASIC METABOLIC PANEL
BUN: 19 mg/dL (ref 6–23)
CO2: 28 mEq/L (ref 19–32)
Calcium: 9.2 mg/dL (ref 8.4–10.5)
Chloride: 102 mEq/L (ref 96–112)
Creatinine, Ser: 0.93 mg/dL (ref 0.40–1.50)
GFR: 83.05 mL/min (ref >60.00)
Glucose, Bld: 163 mg/dL — ABNORMAL HIGH (ref 70–99)
Potassium: 4.2 mEq/L (ref 3.5–5.1)
Sodium: 139 mEq/L (ref 135–145)

## 2022-03-30 LAB — HEMOGLOBIN A1C: Hgb A1c MFr Bld: 7 % — ABNORMAL HIGH (ref 4.6–6.5)

## 2022-03-31 ENCOUNTER — Other Ambulatory Visit: Payer: Self-pay | Admitting: Family Medicine

## 2022-03-31 ENCOUNTER — Telehealth: Payer: Self-pay

## 2022-03-31 NOTE — Telephone Encounter (Signed)
Informed pt of lab resutls

## 2022-03-31 NOTE — Telephone Encounter (Signed)
-----  Message from Midge Minium, MD sent at 03/31/2022  7:27 AM EST ----- Sugar and A1C are up compared to last time but this is likely due to holiday eating.  Please work on low carb/low sugar diet to keep these numbers controlled.

## 2022-05-01 ENCOUNTER — Other Ambulatory Visit: Payer: Self-pay | Admitting: Family Medicine

## 2022-05-10 DIAGNOSIS — E119 Type 2 diabetes mellitus without complications: Secondary | ICD-10-CM | POA: Diagnosis not present

## 2022-05-10 DIAGNOSIS — H5203 Hypermetropia, bilateral: Secondary | ICD-10-CM | POA: Diagnosis not present

## 2022-05-10 DIAGNOSIS — D3131 Benign neoplasm of right choroid: Secondary | ICD-10-CM | POA: Diagnosis not present

## 2022-05-10 DIAGNOSIS — H524 Presbyopia: Secondary | ICD-10-CM | POA: Diagnosis not present

## 2022-05-10 DIAGNOSIS — H52223 Regular astigmatism, bilateral: Secondary | ICD-10-CM | POA: Diagnosis not present

## 2022-05-10 LAB — HM DIABETES EYE EXAM

## 2022-06-09 ENCOUNTER — Other Ambulatory Visit: Payer: Self-pay | Admitting: Family Medicine

## 2022-06-24 ENCOUNTER — Other Ambulatory Visit: Payer: Self-pay | Admitting: Family Medicine

## 2022-07-01 ENCOUNTER — Ambulatory Visit (INDEPENDENT_AMBULATORY_CARE_PROVIDER_SITE_OTHER): Payer: Medicare HMO | Admitting: Family Medicine

## 2022-07-01 ENCOUNTER — Encounter: Payer: Self-pay | Admitting: Family Medicine

## 2022-07-01 VITALS — BP 128/74 | HR 88 | Temp 97.9°F | Resp 17 | Ht 67.0 in | Wt 299.5 lb

## 2022-07-01 DIAGNOSIS — E119 Type 2 diabetes mellitus without complications: Secondary | ICD-10-CM

## 2022-07-01 DIAGNOSIS — I10 Essential (primary) hypertension: Secondary | ICD-10-CM

## 2022-07-01 DIAGNOSIS — E785 Hyperlipidemia, unspecified: Secondary | ICD-10-CM | POA: Diagnosis not present

## 2022-07-01 DIAGNOSIS — E1169 Type 2 diabetes mellitus with other specified complication: Secondary | ICD-10-CM

## 2022-07-01 LAB — TSH: TSH: 4.13 u[IU]/mL (ref 0.35–5.50)

## 2022-07-01 LAB — HEMOGLOBIN A1C: Hgb A1c MFr Bld: 7.8 % — ABNORMAL HIGH (ref 4.6–6.5)

## 2022-07-01 LAB — BASIC METABOLIC PANEL
BUN: 22 mg/dL (ref 6–23)
CO2: 26 mEq/L (ref 19–32)
Calcium: 9.1 mg/dL (ref 8.4–10.5)
Chloride: 102 mEq/L (ref 96–112)
Creatinine, Ser: 0.84 mg/dL (ref 0.40–1.50)
GFR: 88.05 mL/min (ref >60.00)
Glucose, Bld: 190 mg/dL — ABNORMAL HIGH (ref 70–99)
Potassium: 4.2 mEq/L (ref 3.5–5.1)
Sodium: 137 mEq/L (ref 135–145)

## 2022-07-01 LAB — CBC WITH DIFFERENTIAL/PLATELET
Basophils Absolute: 0 10*3/uL (ref 0.0–0.1)
Basophils Relative: 0.7 % (ref 0.0–3.0)
Eosinophils Absolute: 0.2 10*3/uL (ref 0.0–0.7)
Eosinophils Relative: 2.2 % (ref 0.0–5.0)
HCT: 42.9 % (ref 39.0–52.0)
Hemoglobin: 14.5 g/dL (ref 13.0–17.0)
Lymphocytes Relative: 21.4 % (ref 12.0–46.0)
Lymphs Abs: 1.6 10*3/uL (ref 0.7–4.0)
MCHC: 33.9 g/dL (ref 30.0–36.0)
MCV: 85.2 fl (ref 78.0–100.0)
Monocytes Absolute: 0.4 10*3/uL (ref 0.1–1.0)
Monocytes Relative: 5.7 % (ref 3.0–12.0)
Neutro Abs: 5.2 10*3/uL (ref 1.4–7.7)
Neutrophils Relative %: 70 % (ref 43.0–77.0)
Platelets: 201 10*3/uL (ref 150.0–400.0)
RBC: 5.04 Mil/uL (ref 4.22–5.81)
RDW: 12.9 % (ref 11.5–15.5)
WBC: 7.4 10*3/uL (ref 4.0–10.5)

## 2022-07-01 LAB — LIPID PANEL
Cholesterol: 120 mg/dL (ref 0–200)
HDL: 44.6 mg/dL (ref >39.00)
LDL Cholesterol: 62 mg/dL (ref 0–99)
NonHDL: 75.82
Total CHOL/HDL Ratio: 3
Triglycerides: 67 mg/dL (ref 0.0–149.0)
VLDL: 13.4 mg/dL (ref 0.0–40.0)

## 2022-07-01 LAB — MICROALBUMIN / CREATININE URINE RATIO
Creatinine,U: 76.1 mg/dL
Microalb Creat Ratio: 0.9 mg/g (ref 0.0–30.0)
Microalb, Ur: <0.7 mg/dL (ref 0.0–1.9)

## 2022-07-01 LAB — HEPATIC FUNCTION PANEL
ALT: 13 U/L (ref 0–53)
AST: 14 U/L (ref 0–37)
Albumin: 3.9 g/dL (ref 3.5–5.2)
Alkaline Phosphatase: 111 U/L (ref 39–117)
Bilirubin, Direct: 0.1 mg/dL (ref 0.0–0.3)
Total Bilirubin: 0.4 mg/dL (ref 0.2–1.2)
Total Protein: 6.8 g/dL (ref 6.0–8.3)

## 2022-07-01 NOTE — Assessment & Plan Note (Signed)
Chronic problem.  On Crestor 20mg daily w/o difficulty.  Check labs.  Adjust meds prn  

## 2022-07-01 NOTE — Assessment & Plan Note (Signed)
Chronic problem.  Was previously on Glipizide but stopped when he lost weight.  Was attempting to control w/ diet and exercise but given his weight gain, we will likely need to restart medication.  UTD on eye exam.  Foot exam done today.  Microalbumin ordered.

## 2022-07-01 NOTE — Assessment & Plan Note (Signed)
Chronic problem.  Currently well controlled on Amlodipine 5mg  daily and Lisinopril HCTZ 20/12.5mg  daily.  Asymptomatic.  Will check labs due to ACE and diuretic use but no anticipated med changes.  Will follow.

## 2022-07-01 NOTE — Patient Instructions (Signed)
Follow up in 3-4 months to recheck sugars We'll notify you of your lab results and make any changes if needed Continue to work on low carb, low sugar diet and get regular exercise!  You can do it!!! Call with any questions or concerns Stay Safe!  Stay Healthy! HAPPY BELATED BIRTHDAY!!!

## 2022-07-01 NOTE — Progress Notes (Signed)
   Subjective:    Patient ID: George Christensen, male    DOB: Sep 28, 1950, 72 y.o.   MRN: [id number]  HPI DM- chronic problem, was on Glipizide but has stopped.  UTD on eye exam.  Due for foot exam and microalbumin.  Pt reports sugars have been running high.  Has been under considerable stress.  No numbness/tingling of hands/feet  HTN- chronic problem, on Amlodipine 5mg  daily, Lisinopril HCTZ 20/12.5mg  daily w/ good control.  Denies CP, SOB, HA's, visual changes, edema.  Hyperlipidemia- chronic problem, on Crestor 20mg  daily.  No abd pain, N/V.  Obesity- deteriorated.  Pt has gained nearly 20 lbs since January.  Pt admits to not following diet.   Review of Systems For ROS see HPI     Objective:   Physical Exam Vitals reviewed.  Constitutional:      General: He is not in acute distress.    Appearance: Normal appearance. He is well-developed. He is obese. He is not ill-appearing.  HENT:     Head: Normocephalic and atraumatic.  Eyes:     Extraocular Movements: Extraocular movements intact.     Conjunctiva/sclera: Conjunctivae normal.     Pupils: Pupils are equal, round, and reactive to light.  Neck:     Thyroid: No thyromegaly.  Cardiovascular:     Rate and Rhythm: Normal rate and regular rhythm.     Pulses: Normal pulses.     Heart sounds: Normal heart sounds. No murmur heard. Pulmonary:     Effort: Pulmonary effort is normal. No respiratory distress.     Breath sounds: Normal breath sounds.  Abdominal:     General: Bowel sounds are normal. There is no distension.     Palpations: Abdomen is soft.  Musculoskeletal:     Cervical back: Normal range of motion and neck supple.     Right lower leg: No edema.     Left lower leg: No edema.  Lymphadenopathy:     Cervical: No cervical adenopathy.  Skin:    General: Skin is warm and dry.  Neurological:     General: No focal deficit present.     Mental Status: He is alert and oriented to person, place, and time.     Cranial Nerves:  No cranial nerve deficit.  Psychiatric:        Mood and Affect: Mood normal.        Behavior: Behavior normal.           Assessment & Plan:

## 2022-07-01 NOTE — Assessment & Plan Note (Signed)
Deteriorated.  Pt has gained nearly 20 lbs since January and 30 lbs since CPE.  Stressed need for low carb diet and regular physical activity.  Pt knows he's not eating well.  He previously lost a considerable amount of weight following the [employer] plan.  Encouraged him to restart.  Will follow.

## 2022-07-04 ENCOUNTER — Telehealth: Payer: Self-pay

## 2022-07-04 ENCOUNTER — Other Ambulatory Visit: Payer: Self-pay | Admitting: Family Medicine

## 2022-07-04 MED ORDER — GLIPIZIDE ER 10 MG PO TB24: 10.0000 mg | ORAL_TABLET | Freq: Every day | ORAL | 1 refills | Status: DC

## 2022-07-04 NOTE — Telephone Encounter (Signed)
-----   Message from Sheliah Hatch, MD sent at 07/04/2022  7:34 AM EDT ----- Labs are stable and look good w/ exception of A1C which has jumped from 7 --> 7.8%  We will restart the Glipizide 10mg  daily (prescription already sent) and PLEASE be mindful of a low carb, low sugar diet to help get these numbers back in range.

## 2022-07-04 NOTE — Telephone Encounter (Signed)
Pt aware of lab result

## 2022-07-27 ENCOUNTER — Other Ambulatory Visit: Payer: Self-pay | Admitting: Family Medicine

## 2022-08-19 ENCOUNTER — Other Ambulatory Visit: Payer: Self-pay | Admitting: Family Medicine

## 2022-08-24 ENCOUNTER — Other Ambulatory Visit: Payer: Self-pay | Admitting: Family Medicine

## 2022-09-27 ENCOUNTER — Other Ambulatory Visit: Payer: Self-pay | Admitting: Family Medicine

## 2022-09-28 ENCOUNTER — Encounter (INDEPENDENT_AMBULATORY_CARE_PROVIDER_SITE_OTHER): Payer: Self-pay

## 2022-09-30 ENCOUNTER — Other Ambulatory Visit: Payer: Self-pay | Admitting: Family Medicine

## 2022-10-02 ENCOUNTER — Other Ambulatory Visit: Payer: Self-pay | Admitting: Family Medicine

## 2022-10-17 ENCOUNTER — Other Ambulatory Visit: Payer: Self-pay | Admitting: Family Medicine

## 2022-10-24 ENCOUNTER — Ambulatory Visit: Payer: Medicare HMO | Admitting: Family Medicine

## 2022-11-09 ENCOUNTER — Encounter: Payer: Self-pay | Admitting: Family Medicine

## 2022-11-09 ENCOUNTER — Telehealth: Payer: Self-pay

## 2022-11-09 ENCOUNTER — Ambulatory Visit (INDEPENDENT_AMBULATORY_CARE_PROVIDER_SITE_OTHER): Payer: Medicare HMO | Admitting: Family Medicine

## 2022-11-09 VITALS — BP 128/80 | HR 76 | Temp 98.0°F | Resp 17 | Ht 67.0 in | Wt 324.1 lb

## 2022-11-09 DIAGNOSIS — Z7984 Long term (current) use of oral hypoglycemic drugs: Secondary | ICD-10-CM | POA: Diagnosis not present

## 2022-11-09 DIAGNOSIS — E119 Type 2 diabetes mellitus without complications: Secondary | ICD-10-CM

## 2022-11-09 LAB — BASIC METABOLIC PANEL
BUN: 17 mg/dL (ref 6–23)
CO2: 26 meq/L (ref 19–32)
Calcium: 9.4 mg/dL (ref 8.4–10.5)
Chloride: 104 meq/L (ref 96–112)
Creatinine, Ser: 0.97 mg/dL (ref 0.40–1.50)
GFR: 78.62 mL/min (ref >60.00)
Glucose, Bld: 142 mg/dL — ABNORMAL HIGH (ref 70–99)
Potassium: 4.2 meq/L (ref 3.5–5.1)
Sodium: 138 meq/L (ref 135–145)

## 2022-11-09 LAB — HEMOGLOBIN A1C: Hgb A1c MFr Bld: 7.1 % — ABNORMAL HIGH (ref 4.6–6.5)

## 2022-11-09 NOTE — Progress Notes (Signed)
   Subjective:    Patient ID: George Christensen, male    DOB: 11-Sep-1950, 72 y.o.   MRN: [id number]  HPI DM- chronic problem, on Glipizide XL 10mg  daily.  On ACE for renal protection.  UTD on eye exam, foot exam, microalbumin.  No CP, SOB, HA's, visual changes, abd pain, N/V.  No numbness/tingling of hands/feet.  Obesity- pt has gained 24 lbs since May.  Is in the process of building a new house and is eating out for nearly every meal.     Review of Systems For ROS see HPI     Objective:   Physical Exam Vitals reviewed.  Constitutional:      General: He is not in acute distress.    Appearance: Normal appearance. He is well-developed. He is obese. He is not ill-appearing.  HENT:     Head: Normocephalic and atraumatic.  Eyes:     Extraocular Movements: Extraocular movements intact.     Conjunctiva/sclera: Conjunctivae normal.     Pupils: Pupils are equal, round, and reactive to light.  Neck:     Thyroid: No thyromegaly.  Cardiovascular:     Rate and Rhythm: Normal rate and regular rhythm.     Pulses: Normal pulses.     Heart sounds: Normal heart sounds. No murmur heard. Pulmonary:     Effort: Pulmonary effort is normal. No respiratory distress.     Breath sounds: Normal breath sounds.  Abdominal:     General: Bowel sounds are normal. There is no distension.     Palpations: Abdomen is soft.  Musculoskeletal:     Cervical back: Normal range of motion and neck supple.     Right lower leg: No edema.     Left lower leg: No edema.  Lymphadenopathy:     Cervical: No cervical adenopathy.  Skin:    General: Skin is warm and dry.  Neurological:     General: No focal deficit present.     Mental Status: He is alert and oriented to person, place, and time.     Cranial Nerves: No cranial nerve deficit.  Psychiatric:        Mood and Affect: Mood normal.        Behavior: Behavior normal.           Assessment & Plan:

## 2022-11-09 NOTE — Assessment & Plan Note (Signed)
Deteriorated.  Pt has gained 24 lbs since May.  Pt admits to eating out for nearly every meal.  Is not exercising or following low carb diet.  Stressed the need for both.  Will continue to follow.

## 2022-11-09 NOTE — Assessment & Plan Note (Signed)
Chronic problem.  Currently on Glipizide XL 10mg  daily.  UTD on eye exam, foot exam, microalbumin.  Unfortunately he has gained 24 lbs since May and has been eating out for every meal as they are building a new house.  Stressed need for low carb diet and regular physical activity.  Check labs.  Adjust meds prn

## 2022-11-09 NOTE — Patient Instructions (Signed)
Schedule your complete physical in 3-4 months We'll notify you of your lab results and make any changes if needed Continue to work on healthy diet and regular exercise- you can do it! Call with any questions or concerns Stay Safe!  Stay Healthy! Good luck with the house!!!

## 2022-11-09 NOTE — Telephone Encounter (Signed)
Pt is aware of lab results.

## 2022-11-09 NOTE — Telephone Encounter (Signed)
-----   Message from Neena Rhymes sent at 11/09/2022  3:37 PM EDT ----- Labs look good.  No changes at this time

## 2023-01-02 ENCOUNTER — Telehealth: Payer: Self-pay | Admitting: Family Medicine

## 2023-01-02 NOTE — Telephone Encounter (Signed)
Pt has been scheduled for appt °

## 2023-01-02 NOTE — Telephone Encounter (Signed)
Caller name: NASIRE REALI  On DPR?: Yes  Call back number: [phone] (mobile)  Provider they see: Sheliah Hatch, MD  Reason for call:   Pt stares he has aggravated his sciatic nerve and would like a pain reliever called in

## 2023-01-04 ENCOUNTER — Encounter: Payer: Self-pay | Admitting: Family Medicine

## 2023-01-04 ENCOUNTER — Ambulatory Visit: Payer: Medicare HMO | Admitting: Family Medicine

## 2023-01-04 VITALS — BP 138/74 | HR 74 | Temp 98.4°F | Ht 67.0 in | Wt 328.2 lb

## 2023-01-04 DIAGNOSIS — M5431 Sciatica, right side: Secondary | ICD-10-CM

## 2023-01-04 MED ORDER — TIZANIDINE HCL 4 MG PO TABS: 4.0000 mg | ORAL_TABLET | Freq: Three times a day (TID) | ORAL | 0 refills | Status: DC | PRN

## 2023-01-04 NOTE — Progress Notes (Signed)
   Subjective:    Patient ID: George Christensen, male    DOB: 1950/09/19, 72 y.o.   MRN: [id number]  HPI R hip pain- 'i think I made my sciatic nerve angry'.  Pt reports he was unloading hay on Friday and pain developed after that.  By Saturday pain was radiating down R leg to calf.  Pt spent the weekend resting.  Pt is unable to lie on R side.  Started taking Aleve, used heat w/ some relief.  Sxs improved w/ icy hot on Monday.  Yesterday he turned in his desk chair at work and felt 'something snap like a rubber band'.   Review of Systems For ROS see HPI     Objective:   Physical Exam Vitals reviewed.  Constitutional:      General: He is not in acute distress.    Appearance: He is obese. He is not ill-appearing.  HENT:     Head: Normocephalic and atraumatic.  Cardiovascular:     Pulses: Normal pulses.  Musculoskeletal:        General: Tenderness (TTP over R sciatic notch) present.  Skin:    General: Skin is warm and dry.  Neurological:     General: No focal deficit present.     Mental Status: He is alert and oriented to person, place, and time.     Comments: (-) SLR bilaterally  Psychiatric:        Mood and Affect: Mood normal.        Behavior: Behavior normal.        Thought Content: Thought content normal.           Assessment & Plan:  Sciatica- new.  R sided.  Pt reports sxs started Friday after unloading hay.  Improved after resting for the weekend, using heat, and taking Aleve.  Yesterday again had pain but not as severe as before.  Encouraged him to continue Aleve for the next 7 days and to use the muscle relaxer at night.  Reviewed supportive care and red flags that should prompt return.  Pt expressed understanding and is in agreement w/ plan.

## 2023-01-04 NOTE — Patient Instructions (Signed)
Follow up as needed or as scheduled Take the Aleve twice daily- w/ food- for the next 5 days USE the Tizanidine (muscle relaxer) before bed to help w/ pain and spasm Continue to heat or ice- whichever feels better Call with any questions or concerns Stay Safe!  Stay Healthy! Hang in there!!!

## 2023-01-07 ENCOUNTER — Other Ambulatory Visit: Payer: Self-pay | Admitting: Family Medicine

## 2023-01-09 ENCOUNTER — Telehealth: Payer: Self-pay | Admitting: Family Medicine

## 2023-01-09 NOTE — Telephone Encounter (Signed)
Encourage patient to contact the pharmacy for refills or they can request refills through [facility]  (Please schedule appointment if patient has not been seen in over a year)    WHAT PHARMACY WOULD THEY LIKE THIS SENT TO: [id number] - [place], [place] - [address]   MEDICATION NAME & DOSE: rosuvastatin (CRESTOR) 20 MG tablet   NOTES/COMMENTS FROM PATIENT:  Pt took last dose on Saturday 01/07/23      Front office please notify patient: It takes 48-72 hours to process rx refill requests Ask patient to call pharmacy to ensure rx is ready before heading there.

## 2023-01-09 NOTE — Telephone Encounter (Signed)
Rx sent 

## 2023-01-22 ENCOUNTER — Other Ambulatory Visit: Payer: Self-pay | Admitting: Family Medicine

## 2023-02-10 ENCOUNTER — Other Ambulatory Visit: Payer: Self-pay | Admitting: Family Medicine

## 2023-02-18 ENCOUNTER — Other Ambulatory Visit: Payer: Self-pay | Admitting: Family Medicine

## 2023-02-27 ENCOUNTER — Encounter: Payer: Self-pay | Admitting: Family Medicine

## 2023-02-27 ENCOUNTER — Ambulatory Visit (INDEPENDENT_AMBULATORY_CARE_PROVIDER_SITE_OTHER): Payer: Medicare HMO | Admitting: Family Medicine

## 2023-02-27 VITALS — BP 126/70 | HR 83 | Temp 98.0°F | Ht 65.0 in | Wt 322.8 lb

## 2023-02-27 DIAGNOSIS — Z7984 Long term (current) use of oral hypoglycemic drugs: Secondary | ICD-10-CM | POA: Diagnosis not present

## 2023-02-27 DIAGNOSIS — Z125 Encounter for screening for malignant neoplasm of prostate: Secondary | ICD-10-CM

## 2023-02-27 DIAGNOSIS — E119 Type 2 diabetes mellitus without complications: Secondary | ICD-10-CM

## 2023-02-27 DIAGNOSIS — Z0000 Encounter for general adult medical examination without abnormal findings: Secondary | ICD-10-CM

## 2023-02-27 LAB — BASIC METABOLIC PANEL
BUN: 18 mg/dL (ref 6–23)
CO2: 28 meq/L (ref 19–32)
Calcium: 9.2 mg/dL (ref 8.4–10.5)
Chloride: 101 meq/L (ref 96–112)
Creatinine, Ser: 1.04 mg/dL (ref 0.40–1.50)
GFR: 72.16 mL/min (ref >60.00)
Glucose, Bld: 189 mg/dL — ABNORMAL HIGH (ref 70–99)
Potassium: 4.6 meq/L (ref 3.5–5.1)
Sodium: 139 meq/L (ref 135–145)

## 2023-02-27 LAB — PSA, MEDICARE: PSA: 2.68 ng/mL (ref 0.10–4.00)

## 2023-02-27 LAB — CBC WITH DIFFERENTIAL/PLATELET
Basophils Absolute: 0 10*3/uL (ref 0.0–0.1)
Basophils Relative: 0.6 % (ref 0.0–3.0)
Eosinophils Absolute: 0.2 10*3/uL (ref 0.0–0.7)
Eosinophils Relative: 2.3 % (ref 0.0–5.0)
HCT: 45.1 % (ref 39.0–52.0)
Hemoglobin: 14.9 g/dL (ref 13.0–17.0)
Lymphocytes Relative: 24 % (ref 12.0–46.0)
Lymphs Abs: 1.9 10*3/uL (ref 0.7–4.0)
MCHC: 33.1 g/dL (ref 30.0–36.0)
MCV: 87.1 fL (ref 78.0–100.0)
Monocytes Absolute: 0.5 10*3/uL (ref 0.1–1.0)
Monocytes Relative: 6.3 % (ref 3.0–12.0)
Neutro Abs: 5.4 10*3/uL (ref 1.4–7.7)
Neutrophils Relative %: 66.8 % (ref 43.0–77.0)
Platelets: 194 10*3/uL (ref 150.0–400.0)
RBC: 5.18 Mil/uL (ref 4.22–5.81)
RDW: 13.7 % (ref 11.5–15.5)
WBC: 8.1 10*3/uL (ref 4.0–10.5)

## 2023-02-27 LAB — HEPATIC FUNCTION PANEL
ALT: 13 U/L (ref 0–53)
AST: 14 U/L (ref 0–37)
Albumin: 4.2 g/dL (ref 3.5–5.2)
Alkaline Phosphatase: 123 U/L — ABNORMAL HIGH (ref 39–117)
Bilirubin, Direct: 0.1 mg/dL (ref 0.0–0.3)
Total Bilirubin: 0.4 mg/dL (ref 0.2–1.2)
Total Protein: 6.5 g/dL (ref 6.0–8.3)

## 2023-02-27 LAB — TSH: TSH: 4.71 u[IU]/mL (ref 0.35–5.50)

## 2023-02-27 LAB — HEMOGLOBIN A1C: Hgb A1c MFr Bld: 8.1 % — ABNORMAL HIGH (ref 4.6–6.5)

## 2023-02-27 LAB — LIPID PANEL
Cholesterol: 151 mg/dL (ref 0–200)
HDL: 44.5 mg/dL (ref >39.00)
LDL Cholesterol: 87 mg/dL (ref 0–99)
NonHDL: 106.47
Total CHOL/HDL Ratio: 3
Triglycerides: 96 mg/dL (ref 0.0–149.0)
VLDL: 19.2 mg/dL (ref 0.0–40.0)

## 2023-02-27 NOTE — Progress Notes (Signed)
   Subjective:    Patient ID: George Christensen, male    DOB: May 09, 1950, 72 y.o.   MRN: [id number]  HPI CPE- Due for PNA, flu, Tdap (pt declines).  UTD on eye exam, microalbumin, foot exam, cologuard.  Patient Care Team    Relationship Specialty Notifications Start End  Sheliah Hatch, MD PCP - General Family Medicine  07/18/16   Glenford Peers, OD Referring Physician Optometry  07/18/16      Health Maintenance  Topic Date Due   Medicare Annual Wellness (AWV)  Never done   Pneumonia Vaccine 55+ Years old (1 of 2 - PCV) Never done   Hepatitis C Screening  Never done   DTaP/Tdap/Td (2 - Td or Tdap) 03/01/2019   INFLUENZA VACCINE  09/29/2022   COVID-19 Vaccine (5 - 2024-25 season) 10/30/2022   HEMOGLOBIN A1C  05/09/2023   OPHTHALMOLOGY EXAM  05/10/2023   Diabetic kidney evaluation - Urine ACR  07/01/2023   FOOT EXAM  07/01/2023   Diabetic kidney evaluation - eGFR measurement  11/09/2023   Fecal DNA (Cologuard)  03/31/2024   HPV VACCINES  Aged Out   Zoster Vaccines- Shingrix  Discontinued      Review of Systems Patient reports no vision/hearing changes, anorexia, fever ,adenopathy, persistant/recurrent hoarseness, swallowing issues, chest pain, palpitations, edema, persistant/recurrent cough, hemoptysis, dyspnea (rest,exertional, paroxysmal nocturnal), gastrointestinal  bleeding (melena, rectal bleeding), abdominal pain, excessive heart burn, GU symptoms (dysuria, hematuria, voiding/incontinence issues) syncope, focal weakness, memory loss, numbness & tingling, skin/hair/nail changes, depression, anxiety, abnormal bruising/bleeding, musculoskeletal symptoms/signs.     Objective:   Physical Exam General Appearance:    Alert, cooperative, no distress, appears stated age, obese  Head:    Normocephalic, without obvious abnormality, atraumatic  Eyes:    PERRL, conjunctiva/corneas clear, EOM's intact both eyes       Ears:    Normal TM's and external ear canals, both ears  Nose:    Nares normal, septum midline, mucosa normal, no drainage   or sinus tenderness  Throat:   Lips, mucosa, and tongue normal; teeth and gums normal  Neck:   Supple, symmetrical, trachea midline, no adenopathy;       thyroid:  No enlargement/tenderness/nodules  Back:     Symmetric, no curvature, ROM normal, no CVA tenderness  Lungs:     Clear to auscultation bilaterally, respirations unlabored  Chest wall:    No tenderness or deformity  Heart:    Regular rate and rhythm, S1 and S2 normal, no murmur, rub   or gallop  Abdomen:     Soft, non-tender, bowel sounds active all four quadrants,    no masses, no organomegaly  Genitalia:    deferred  Rectal:    Extremities:   Extremities normal, atraumatic, no cyanosis or edema  Pulses:   2+ and symmetric all extremities  Skin:   Skin color, texture, turgor normal, no rashes or lesions  Lymph nodes:   Cervical, supraclavicular, and axillary nodes normal  Neurologic:   CNII-XII intact. Normal strength, sensation and reflexes      throughout          Assessment & Plan:

## 2023-02-27 NOTE — Assessment & Plan Note (Signed)
Chronic problem.  UTD on eye exam, foot exam, microalbumin.  Check labs.  Start meds prn.

## 2023-02-27 NOTE — Assessment & Plan Note (Signed)
Pt's PE WNL w/ exception of BMI.  Declines PNA, flu, Tdap.  Check labs.  Anticipatory guidance provided.

## 2023-02-27 NOTE — Patient Instructions (Signed)
Follow up in 3-4 months to recheck sugar We'll notify you of your lab results and make any changes if needed Continue to work on healthy diet and regular exercise- you're down 6 lbs!! Call with any questions or concerns Stay Safe!  Stay Healthy! Happy New Year!!

## 2023-02-28 MED ORDER — EMPAGLIFLOZIN 10 MG PO TABS: 10.0000 mg | ORAL_TABLET | Freq: Every day | ORAL | 3 refills | Status: DC

## 2023-02-28 NOTE — Telephone Encounter (Signed)
 Side effects are increased urination (it causes you to pee out the excess sugar).  It can cause yeast infections of the skin b/c of the increased sugar in the urine so it's very important to keep the skin clean and dry.  These are the biggest side effects.

## 2023-02-28 NOTE — Telephone Encounter (Signed)
-----   Message from Comer Greet sent at 02/28/2023  7:35 AM EST ----- Your A1C has jumped from 7.1 --> 8.1%  This indicates poor sugar control.  This will improve w/ healthy diet and regular exercise.  For now, we will add Metformin  500mg  twice daily (#60, 3 refills) and once sugars improve, we can stop medication.  Remainder of labs look good

## 2023-02-28 NOTE — Telephone Encounter (Signed)
 Since pt cannot tolerate Metformin, will start Jardiance.  Already sent to pharmacy

## 2023-03-29 ENCOUNTER — Other Ambulatory Visit: Payer: Self-pay | Admitting: Family Medicine

## 2023-04-01 ENCOUNTER — Other Ambulatory Visit: Payer: Self-pay | Admitting: Family Medicine

## 2023-04-05 ENCOUNTER — Encounter: Payer: Self-pay | Admitting: Family Medicine

## 2023-04-05 ENCOUNTER — Telehealth: Payer: Self-pay

## 2023-04-05 ENCOUNTER — Ambulatory Visit (INDEPENDENT_AMBULATORY_CARE_PROVIDER_SITE_OTHER): Payer: 59 | Admitting: Family Medicine

## 2023-04-05 VITALS — BP 132/74 | Temp 97.8°F | Ht 65.0 in | Wt 329.2 lb

## 2023-04-05 DIAGNOSIS — H6121 Impacted cerumen, right ear: Secondary | ICD-10-CM

## 2023-04-05 DIAGNOSIS — E119 Type 2 diabetes mellitus without complications: Secondary | ICD-10-CM

## 2023-04-05 DIAGNOSIS — Z7984 Long term (current) use of oral hypoglycemic drugs: Secondary | ICD-10-CM | POA: Diagnosis not present

## 2023-04-05 DIAGNOSIS — J069 Acute upper respiratory infection, unspecified: Secondary | ICD-10-CM

## 2023-04-05 MED ORDER — EMPAGLIFLOZIN 10 MG PO TABS: 10.0000 mg | ORAL_TABLET | Freq: Every day | ORAL | 3 refills | Status: DC

## 2023-04-05 MED ORDER — PANTOPRAZOLE SODIUM 40 MG PO TBEC: 40.0000 mg | DELAYED_RELEASE_TABLET | Freq: Every day | ORAL | 1 refills | Status: DC

## 2023-04-05 MED ORDER — TIZANIDINE HCL 4 MG PO TABS: 4.0000 mg | ORAL_TABLET | Freq: Three times a day (TID) | ORAL | 0 refills | Status: DC | PRN

## 2023-04-05 MED ORDER — ONETOUCH VERIO VI STRP: ORAL_STRIP | 12 refills | Status: AC

## 2023-04-05 MED ORDER — ALBUTEROL SULFATE HFA 108 (90 BASE) MCG/ACT IN AERS: INHALATION_SPRAY | RESPIRATORY_TRACT | 1 refills | Status: DC

## 2023-04-05 NOTE — Telephone Encounter (Signed)
 Copied from CRM [id number]. Topic: Clinical - Prescription Issue >> Apr 05, 2023  4:26 PM Adaysia C wrote: Reason for CRM: Patient checked the cost of the empagliflozin  (JARDIANCE ) 10 MG TABS tablet prescription with his insurance and he said it is too expensive for him and he would like for the provider to prescribe him a cheaper alternative. Please follow up with the patient [phone]

## 2023-04-05 NOTE — Progress Notes (Signed)
   Subjective:    Patient ID: George Christensen, male    DOB: 09/25/1950, 73 y.o.   MRN: [id number]  HPI Ears clogged- 'i can't hear'.  R sided.  Sxs started Friday.  He rubbed his ear and it 'popped' and hearing came back until Sunday when ear closed off again.  No drainage.  No pain.  DM- pt did not want to take the Metformin  due to hx of diarrhea.  Was prescribed Jardiance  but pt did not pick up.  He's not sure of the reason he didn't get it.   Review of Systems For ROS see HPI     Objective:   Physical Exam Vitals reviewed.  Constitutional:      General: He is not in acute distress.    Appearance: Normal appearance. He is obese.  HENT:     Head: Normocephalic and atraumatic.     Right Ear: There is impacted cerumen (pt consented to irrigation.  this was done successfully and he had immediate relief.  tolerated w/o difficulty).     Left Ear: Tympanic membrane and ear canal normal.  Skin:    General: Skin is warm and dry.  Neurological:     General: No focal deficit present.     Mental Status: He is alert and oriented to person, place, and time.  Psychiatric:        Mood and Affect: Mood normal.        Behavior: Behavior normal.        Thought Content: Thought content normal.           Assessment & Plan:  Hearing loss due to cerumen impaction- new.  Pt consented to irrigation.  Wax was successfully removed and pt had immediate improvement in hearing.  Tolerated w/o difficulty and was appreciative.  DM- pt is not sure why he didn't pick up his Jardiance .  He did not want Metformin  due to hx of diarrhea.  Re-sent medication and told him to let me know if there's an issue.  Pt expressed understanding and is in agreement w/ plan.

## 2023-04-05 NOTE — Patient Instructions (Signed)
Follow up as needed or as scheduled I sent in the Jardiance again.  Please let me know if this is too expensive Call with any questions or concerns Stay Safe!  Stay Healthy!

## 2023-04-06 MED ORDER — GLIPIZIDE ER 5 MG PO TB24: 5.0000 mg | ORAL_TABLET | Freq: Every day | ORAL | 3 refills | Status: DC

## 2023-04-06 NOTE — Addendum Note (Signed)
 Addended by: Joseguadalupe Stan E on: 04/06/2023 07:26 AM   Modules accepted: Orders

## 2023-04-06 NOTE — Telephone Encounter (Signed)
 Left vm to call office back to inform pt of the new rx that has been sent in

## 2023-04-06 NOTE — Telephone Encounter (Signed)
 Prescription for Glipizide  sent to pharmacy

## 2023-04-07 NOTE — Telephone Encounter (Signed)
 Patient aware that med was sent to pharmacy. He stated that he would check the price on it.

## 2023-04-07 NOTE — Telephone Encounter (Signed)
 Pt reports he did not want to pay $50 month for Jariance - pt feels this is to high Pt would like to know more about the gliPZIDE and why he needs to take this? Please advise

## 2023-04-07 NOTE — Telephone Encounter (Signed)
 Copied from CRM [id number]. Topic: Clinical - Medication Question >> Apr 07, 2023  8:44 AM Viola F wrote: Reason for CRM: Patient returning Danna Casella's phone call, I did let him know that the glipiZIDE  was sent to his pharmacy but he would like to know what it's for?

## 2023-04-07 NOTE — Telephone Encounter (Signed)
 The Glipizide  is a diabetes medicine that has been around for quite awhile and should not be nearly as expensive.  He needs to be on some sort of medication as his sugar and A1C is too high and he has not tolerated Metformin  and Jardiance  copay is too high

## 2023-04-10 NOTE — Telephone Encounter (Signed)
 Left vm to call office

## 2023-04-11 NOTE — Telephone Encounter (Signed)
LM to call the office back

## 2023-04-12 NOTE — Telephone Encounter (Signed)
Letter out -unable to reach

## 2023-04-12 NOTE — Telephone Encounter (Signed)
Left vm to call office

## 2023-04-13 NOTE — Telephone Encounter (Signed)
Copied from CRM [id number]. Topic: General - Call Back - No Documentation >> Apr 13, 2023  1:57 PM Florestine Avers wrote: Reason for CRM: Patient states he had a missed called from Dr. Beverely Low nurse and is requesting a call back.

## 2023-04-14 ENCOUNTER — Other Ambulatory Visit: Payer: Self-pay

## 2023-04-14 NOTE — Telephone Encounter (Signed)
Patient has been contacted and voiced understanding he has picked up this medication at this time and will start today

## 2023-05-27 ENCOUNTER — Other Ambulatory Visit: Payer: Self-pay | Admitting: Family Medicine

## 2023-06-24 ENCOUNTER — Other Ambulatory Visit: Payer: Self-pay | Admitting: Family Medicine

## 2023-06-24 DIAGNOSIS — J069 Acute upper respiratory infection, unspecified: Secondary | ICD-10-CM

## 2023-06-25 ENCOUNTER — Other Ambulatory Visit: Payer: Self-pay | Admitting: Family Medicine

## 2023-06-27 ENCOUNTER — Encounter: Payer: Self-pay | Admitting: Family Medicine

## 2023-06-27 ENCOUNTER — Ambulatory Visit (INDEPENDENT_AMBULATORY_CARE_PROVIDER_SITE_OTHER): Payer: Medicare HMO | Admitting: Family Medicine

## 2023-06-27 VITALS — BP 130/80 | HR 89 | Wt 326.0 lb

## 2023-06-27 DIAGNOSIS — E119 Type 2 diabetes mellitus without complications: Secondary | ICD-10-CM | POA: Diagnosis not present

## 2023-06-27 DIAGNOSIS — Z7984 Long term (current) use of oral hypoglycemic drugs: Secondary | ICD-10-CM

## 2023-06-27 LAB — BASIC METABOLIC PANEL WITH GFR
BUN: 18 mg/dL (ref 6–23)
CO2: 27 meq/L (ref 19–32)
Calcium: 9.2 mg/dL (ref 8.4–10.5)
Chloride: 101 meq/L (ref 96–112)
Creatinine, Ser: 0.92 mg/dL (ref 0.40–1.50)
GFR: 83.41 mL/min (ref >60.00)
Glucose, Bld: 170 mg/dL — ABNORMAL HIGH (ref 70–99)
Potassium: 4.1 meq/L (ref 3.5–5.1)
Sodium: 137 meq/L (ref 135–145)

## 2023-06-27 LAB — MICROALBUMIN / CREATININE URINE RATIO
Creatinine,U: 181.1 mg/dL
Microalb Creat Ratio: 5 mg/g (ref 0.0–30.0)
Microalb, Ur: 0.9 mg/dL (ref 0.0–1.9)

## 2023-06-27 LAB — HEMOGLOBIN A1C: Hgb A1c MFr Bld: 7.9 % — ABNORMAL HIGH (ref 4.6–6.5)

## 2023-06-27 NOTE — Patient Instructions (Addendum)
 Follow up in 3-4 months to recheck sugar We'll notify you of your lab results and make any changes if needed Continue to work on healthy diet and regular exercise- you can do it! Schedule your eye exam at your convenience Call with any questions or concerns Stay Safe!  Stay Healthy! HAPPY BIRTHDAY!!

## 2023-06-27 NOTE — Progress Notes (Signed)
   Subjective:    Patient ID: George Christensen, male    DOB: [birth year], 73 y.o.   MRN: [id number]  HPI DM- chronic problem, on Glipizide  10mg  daily.  Last A1C 8.1%  Due for eye exam, microalbumin.  Coming due for foot exam.  Denies CP, SOB, HA's, visual changes, abd pain, N/V.  Denies symptomatic lows.  No numbness/tingling of hands/feet.   Review of Systems For ROS see HPI     Objective:   Physical Exam Vitals reviewed.  Constitutional:      General: He is not in acute distress.    Appearance: Normal appearance. He is well-developed. He is obese. He is not ill-appearing.  HENT:     Head: Normocephalic and atraumatic.  Eyes:     Extraocular Movements: Extraocular movements intact.     Conjunctiva/sclera: Conjunctivae normal.     Pupils: Pupils are equal, round, and reactive to light.  Neck:     Thyroid : No thyromegaly.  Cardiovascular:     Rate and Rhythm: Normal rate and regular rhythm.     Pulses: Normal pulses.     Heart sounds: Normal heart sounds. No murmur heard. Pulmonary:     Effort: Pulmonary effort is normal. No respiratory distress.     Breath sounds: Normal breath sounds.  Abdominal:     General: Bowel sounds are normal. There is no distension.     Palpations: Abdomen is soft.  Musculoskeletal:     Cervical back: Normal range of motion and neck supple.     Right lower leg: No edema.     Left lower leg: No edema.  Lymphadenopathy:     Cervical: No cervical adenopathy.  Skin:    General: Skin is warm and dry.  Neurological:     General: No focal deficit present.     Mental Status: He is alert and oriented to person, place, and time.     Cranial Nerves: No cranial nerve deficit.  Psychiatric:        Mood and Affect: Mood normal.        Behavior: Behavior normal.           Assessment & Plan:

## 2023-06-27 NOTE — Assessment & Plan Note (Signed)
 Ongoing issue for pt.  He is intolerant to Metformin  and had issues w/ medication cost.  Currently only on Glipizide .  Encouraged him to schedule eye exam.  Foot exam done today.  Check labs.  Adjust meds prn

## 2023-06-28 ENCOUNTER — Encounter: Payer: Self-pay | Admitting: Family Medicine

## 2023-08-14 ENCOUNTER — Ambulatory Visit (INDEPENDENT_AMBULATORY_CARE_PROVIDER_SITE_OTHER): Admitting: Family Medicine

## 2023-08-14 ENCOUNTER — Encounter: Payer: Self-pay | Admitting: Family Medicine

## 2023-08-14 VITALS — BP 132/68 | HR 71 | Temp 99.0°F | Ht 65.0 in | Wt 328.4 lb

## 2023-08-14 DIAGNOSIS — R350 Frequency of micturition: Secondary | ICD-10-CM

## 2023-08-14 LAB — POCT URINALYSIS DIPSTICK
Blood, UA: NEGATIVE
Glucose, UA: NEGATIVE
Ketones, UA: NEGATIVE
Nitrite, UA: POSITIVE
Protein, UA: POSITIVE — AB
Spec Grav, UA: 1.015 (ref 1.010–1.025)
Urobilinogen, UA: 0.2 U/dL
pH, UA: 6 (ref 5.0–8.0)

## 2023-08-14 MED ORDER — CEPHALEXIN 500 MG PO CAPS
500.0000 mg | ORAL_CAPSULE | Freq: Two times a day (BID) | ORAL | 0 refills | Status: DC
Start: 2023-08-14 — End: 2023-10-04

## 2023-08-14 NOTE — Progress Notes (Signed)
   Subjective:    Patient ID: George Christensen, male    DOB: 1951/01/14, 73 y.o.   MRN: [id number]  HPI Urinary frequency- sxs started last night.  He had the sensation that bladder did not empty completely.  Was up hourly overnight.  Felt he had to force urine.  No blood in urine.  Overnight developed body aches, chills, HA.  Denies suprapubic pain/pressure.  No CVA tenderness.  No burning w/ urination.  Pt has hx of similar   Review of Systems For ROS see HPI     Objective:   Physical Exam Vitals reviewed.  Constitutional:      General: He is not in acute distress.    Appearance: Normal appearance. He is obese. He is not ill-appearing.   Cardiovascular:     Rate and Rhythm: Normal rate and regular rhythm.  Pulmonary:     Effort: Pulmonary effort is normal.  Abdominal:     General: There is no distension.     Tenderness: There is no abdominal tenderness. There is no right CVA tenderness, left CVA tenderness, guarding or rebound.   Skin:    General: Skin is warm and dry.   Neurological:     General: No focal deficit present.     Mental Status: He is alert and oriented to person, place, and time.   Psychiatric:        Mood and Affect: Mood normal.        Behavior: Behavior normal.        Thought Content: Thought content normal.           Assessment & Plan:  Urinary frequency- new.  Pt reports hx of similar and hx of UTIs.  No CVA or suprapubic tenderness, but UA is concerning for infxn.  Given age, will start Keflex rather than cipro (due to risk of tendon rupture) but will adjust abx if needed based on cx results.  Pt expressed understanding and is in agreement w/ plan.

## 2023-08-14 NOTE — Patient Instructions (Signed)
 Follow up as needed or as scheduled We'll notify you of your urine culture and make any changes if needed START the Cephalexin twice daily x7 days Drink LOTS of water to flush your bladder Call with any questions or concerns Hang in there!!

## 2023-08-16 ENCOUNTER — Ambulatory Visit: Payer: Self-pay | Admitting: Family Medicine

## 2023-08-16 ENCOUNTER — Ambulatory Visit: Payer: Self-pay

## 2023-08-16 LAB — URINE CULTURE
MICRO NUMBER:: 16584847
SPECIMEN QUALITY:: ADEQUATE

## 2023-08-16 NOTE — Telephone Encounter (Signed)
 Attempted call to both numbers and they are both giving a busy signal

## 2023-08-16 NOTE — Telephone Encounter (Signed)
 Contacted patient and discussed he is doing okay today just some loose stools at this time, may have had a fever yesterday but no temp taken just cold feeling then sweating overnight

## 2023-08-16 NOTE — Telephone Encounter (Signed)
 The diarrhea is likely an antibiotic issue but the inability to urinate is likely due to the infection and not the antibiotic.  The preliminary urine culture does show infection but the antibiotic sensitivities are not yet available to help us  guide our antibiotic decisions/changes.  Is the diarrhea watery and frequent or are stool just loose?  If loose stools we can continue the current antibiotics until the culture results are final.  If watery and occurring frequently- we can make a change.

## 2023-08-16 NOTE — Telephone Encounter (Signed)
 Please advise, pt was advised to see you within 24 hours due to side effects after starting abx pt declined asking for a phone call with you

## 2023-08-16 NOTE — Telephone Encounter (Signed)
  FYI Only or Action Required?: Action required by provider  Patient was last seen in primary care on 08/14/2023 by Jess Morita, MD. Called Nurse Triage reporting Medication Problem. Symptoms began several days ago. Interventions attempted: Prescription medications: keflex. Symptoms are: unchanged.  Triage Disposition: See Physician Within 24 Hours  Patient/caregiver understands and will follow disposition?: No, wishes to speak with PCP        Patient called in stating he's having side effects to medication cephALEXin (KEFLEX) 500 MG capsule. 2nd day had diarrhea and hard to empty bladder.      Reason for Disposition  [1] Taking antibiotic > 72 hours (3 days) for UTI AND [2] painful urination or frequency is SAME (unchanged, not better)  Answer Assessment - Initial Assessment Questions 1. NAME of MEDICINE: What medicine(s) are you calling about?     keflex 2. QUESTION: What is your question? (e.g., double dose of medicine, side effect)     Side effects 3. PRESCRIBER: Who prescribed the medicine? Reason: if prescribed by specialist, call should be referred to that group.     tabori 4. SYMPTOMS: Do you have any symptoms? If Yes, ask: What symptoms are you having?  How bad are the symptoms (e.g., mild, moderate, severe)     Diarrhea, still having chills, sweats and now having urinary urgency and rentention.  Answer Assessment - Initial Assessment Questions 1. MAIN SYMPTOM: What is the main symptom you are concerned about? (e.g., painful urination, urine frequency)     Urinary rentention 2. BETTER-SAME-WORSE: Are you getting better, staying the same, or getting worse compared to how you felt at your last visit to the doctor (most recent medical visit)?     worse 3. PAIN: How bad is the pain?  (e.g., Scale 1-10; mild, moderate, or severe)   - MILD (1-3): complains slightly about urination hurting   - MODERATE (4-7): interferes with normal activities     -  SEVERE (8-10): excruciating, unwilling or unable to urinate because of the pain      mild 4. FEVER: Do you have a fever? If Yes, ask: What is it, how was it measured, and when did it start?     Didn't take temp 5. OTHER SYMPTOMS: Do you have any other symptoms? (e.g., blood in the urine, flank pain, scrotal pain or swelling)     Sweats, chills, diarrhea 6. DIAGNOSIS: When was the UTI diagnosed? By whom? Was it a kidney infection, bladder infection or both?     Dr. Paulla Bossier 7. ANTIBIOTIC: What antibiotic(s) are you taking? How many times per day?     keflex 8. ANTIBIOTIC - START DATE: When did you start taking the antibiotic?     08/14/23    Pt states that he has the urge to urinate and then he starts and he has a hard time getting it out. States that he is going almost every hour but can't get a good flow.  Protocols used: Medication Question Call-A-AH, Urinary Tract Infection on Antibiotic Follow-up Call - Male-A-AH

## 2023-08-23 ENCOUNTER — Encounter: Payer: Self-pay | Admitting: Family Medicine

## 2023-08-23 ENCOUNTER — Ambulatory Visit (INDEPENDENT_AMBULATORY_CARE_PROVIDER_SITE_OTHER): Admitting: Family Medicine

## 2023-08-23 ENCOUNTER — Ambulatory Visit: Payer: Self-pay

## 2023-08-23 VITALS — BP 126/76 | HR 90 | Temp 97.5°F | Ht 65.0 in | Wt 321.2 lb

## 2023-08-23 DIAGNOSIS — R3911 Hesitancy of micturition: Secondary | ICD-10-CM | POA: Diagnosis not present

## 2023-08-23 LAB — POCT URINALYSIS DIPSTICK
Bilirubin, UA: NEGATIVE
Blood, UA: NEGATIVE
Glucose, UA: NEGATIVE
Ketones, UA: NEGATIVE
Nitrite, UA: NEGATIVE
Protein, UA: NEGATIVE
Spec Grav, UA: 1.01 (ref 1.010–1.025)
Urobilinogen, UA: 0.2 U/dL
pH, UA: 6 (ref 5.0–8.0)

## 2023-08-23 NOTE — Progress Notes (Signed)
   Subjective:    Patient ID: OAKLEE SUNGA, male    DOB: May 07, 1950, 73 y.o.   MRN: [id number]  HPI Urinary hesitancy-finished Keflex on Sunday for E Coli UTI.  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  Now has sensation of needing to urinate but unable to 'push it out'.  Needs to contract stomach muscles in order to force urine.  States that sxs are better today than they have been.  Currently on Terazosin  10mg  nightly.     Review of Systems For ROS see HPI     Objective:   Physical Exam Vitals reviewed.  Constitutional:      General: He is not in acute distress.    Appearance: Normal appearance. He is obese. He is not ill-appearing.  HENT:     Head: Normocephalic and atraumatic.   Cardiovascular:     Rate and Rhythm: Normal rate and regular rhythm.  Pulmonary:     Effort: Pulmonary effort is normal. No respiratory distress.  Abdominal:     General: There is no distension.     Tenderness: There is no abdominal tenderness. There is no right CVA tenderness, left CVA tenderness, guarding or rebound.   Skin:    General: Skin is warm and dry.   Neurological:     General: No focal deficit present.     Mental Status: He is alert and oriented to person, place, and time.   Psychiatric:        Mood and Affect: Mood normal.        Behavior: Behavior normal.        Thought Content: Thought content normal.           Assessment & Plan:  Urinary hesitancy- new.  Pt reports he is having a difficult time initiating urination and has to contract stomach muscles to force urination.  UA has + leuks.  Just finished course of Keflex.  Will re-culture urine and tx if infxn present.  Offered urology referral.  Pt declined- wants to wait for urine cx results.  Encouraged increased fluid intake.  Will follow closely.

## 2023-08-23 NOTE — Telephone Encounter (Signed)
 Pt sees you later today

## 2023-08-23 NOTE — Telephone Encounter (Signed)
 FYI Only or Action Required?: FYI only for provider.  Patient was last seen in primary care on 08/14/2023 by Mahlon Comer BRAVO, MD. Called Nurse Triage reporting urinary sx. Symptoms began several days ago. Interventions attempted: Prescription medications: completed abx Sunday and Rest, hydration, or home remedies. Symptoms are: gradually worsening.  Triage Disposition: See Physician Within 24 Hours  Patient/caregiver understands and will follow disposition?:   Message from [place] H sent at 08/23/2023  8:43 AM EDT  Reason for Triage: trouble with urination after taking course of antibiotics for bladder infection.Still having restricted urination   Reason for Disposition  [1] Taking antibiotic > 72 hours (3 days) for UTI AND [2] painful urination or frequency is SAME (unchanged, not better)  Blood glucose 70-240 mg/dL (3.9 -86.6 mmol/L)  Answer Assessment - Initial Assessment Questions 1. MAIN SYMPTOM: What is the main symptom you are concerned about? (e.g., painful urination, urine frequency)     Recent treatment for uti, completed abx sunday. Still having symptoms,.  2. BETTER-SAME-WORSE: Are you getting better, staying the same, or getting worse compared to how you felt at your last visit to the doctor (most recent medical visit)?     Nocturnal 2-3 times per night he has difficulty starting stream.  3. PAIN: How bad is the pain?  (e.g., Scale 1-10; mild, moderate, or severe)   - MILD (1-3): complains slightly about urination hurting   - MODERATE (4-7): interferes with normal activities     - SEVERE (8-10): excruciating, unwilling or unable to urinate because of the pain      Mild 4. FEVER: Do you have a fever? If Yes, ask: What is it, how was it measured, and when did it start?     denies 5. OTHER SYMPTOMS: Do you have any other symptoms? (e.g., blood in the urine, flank pain, scrotal pain or swelling)     Bladder fullness at time 6. DIAGNOSIS: When was the UTI  diagnosed? By whom? Was it a kidney infection, bladder infection or both?     08/14/23 7. ANTIBIOTIC: What antibiotic(s) are you taking? How many times per day?     keflex 8. ANTIBIOTIC - START DATE: When did you start taking the antibiotic?     Completed  Additional info:  Feels like blockage or something in urethra and then will start stream and feeling subsides.  Answer Assessment - Initial Assessment Questions 1. BLOOD GLUCOSE: What is your blood glucose level?      Recently going to 200, since uti        2. OTHER SYMPTOMS: Do you have any symptoms? (e.g., fever, frequent urination, difficulty breathing, dizziness, weakness, vomiting)     Denies. Recently treated for uti with continued difficulty starting stream.   Additional info: eating well, active, taking meds as prescribed.  Protocols used: Urinary Tract Infection on Antibiotic Follow-up Call - Male-A-AH, Diabetes - High Blood Sugar-A-AH

## 2023-08-23 NOTE — Patient Instructions (Signed)
 Follow up as needed or as scheduled Make sure you are drinking LOTS of water We'll notify you of your lab results and start any antibiotics if needed If there's no infection, we'll do the urology referral Call with any questions or concerns Stay Safe!  Stay Healthy! Hang in there!!

## 2023-08-24 LAB — URINE CULTURE
MICRO NUMBER:: 16623962
SPECIMEN QUALITY:: ADEQUATE

## 2023-08-27 ENCOUNTER — Ambulatory Visit: Payer: Self-pay | Admitting: Family Medicine

## 2023-08-29 NOTE — Telephone Encounter (Signed)
-----   Message from Comer Greet sent at 08/27/2023 11:10 AM EDT ----- No evidence of UTI after completing antibiotics.  Please let us  know how you are feeling and if you want us  to proceed with a urology referral. ----- Message ----- From: Fleta Bobbetta BIRCH, CMA Sent: 08/23/2023   3:20 PM EDT To: Comer FORBES Greet, MD

## 2023-08-29 NOTE — Telephone Encounter (Signed)
 Pt is feeling better and denied Urology referral

## 2023-09-20 ENCOUNTER — Other Ambulatory Visit: Payer: Self-pay | Admitting: Family Medicine

## 2023-09-25 ENCOUNTER — Other Ambulatory Visit: Payer: Self-pay | Admitting: Family Medicine

## 2023-09-28 LAB — HM DIABETES EYE EXAM

## 2023-10-04 ENCOUNTER — Encounter: Payer: Self-pay | Admitting: Family Medicine

## 2023-10-04 ENCOUNTER — Ambulatory Visit: Admitting: Family Medicine

## 2023-10-04 VITALS — BP 132/98 | HR 80 | Temp 97.9°F | Ht 65.0 in | Wt 326.0 lb

## 2023-10-04 DIAGNOSIS — Z6841 Body Mass Index (BMI) 40.0 and over, adult: Secondary | ICD-10-CM

## 2023-10-04 DIAGNOSIS — E119 Type 2 diabetes mellitus without complications: Secondary | ICD-10-CM

## 2023-10-04 DIAGNOSIS — E785 Hyperlipidemia, unspecified: Secondary | ICD-10-CM

## 2023-10-04 DIAGNOSIS — I10 Essential (primary) hypertension: Secondary | ICD-10-CM | POA: Diagnosis not present

## 2023-10-04 DIAGNOSIS — E1169 Type 2 diabetes mellitus with other specified complication: Secondary | ICD-10-CM | POA: Diagnosis not present

## 2023-10-04 LAB — HEPATIC FUNCTION PANEL
ALT: 14 U/L (ref 0–53)
AST: 14 U/L (ref 0–37)
Albumin: 3.9 g/dL (ref 3.5–5.2)
Alkaline Phosphatase: 92 U/L (ref 39–117)
Bilirubin, Direct: 0.1 mg/dL (ref 0.0–0.3)
Total Bilirubin: 0.4 mg/dL (ref 0.2–1.2)
Total Protein: 6.7 g/dL (ref 6.0–8.3)

## 2023-10-04 LAB — BASIC METABOLIC PANEL WITH GFR
BUN: 15 mg/dL (ref 6–23)
CO2: 28 meq/L (ref 19–32)
Calcium: 9.1 mg/dL (ref 8.4–10.5)
Chloride: 99 meq/L (ref 96–112)
Creatinine, Ser: 0.89 mg/dL (ref 0.40–1.50)
GFR: 85.76 mL/min (ref >60.00)
Glucose, Bld: 177 mg/dL — ABNORMAL HIGH (ref 70–99)
Potassium: 4.2 meq/L (ref 3.5–5.1)
Sodium: 135 meq/L (ref 135–145)

## 2023-10-04 LAB — CBC WITH DIFFERENTIAL/PLATELET
Basophils Absolute: 0 K/uL (ref 0.0–0.1)
Basophils Relative: 0.7 % (ref 0.0–3.0)
Eosinophils Absolute: 0.2 K/uL (ref 0.0–0.7)
Eosinophils Relative: 3 % (ref 0.0–5.0)
HCT: 40.3 % (ref 39.0–52.0)
Hemoglobin: 13.6 g/dL (ref 13.0–17.0)
Lymphocytes Relative: 24.6 % (ref 12.0–46.0)
Lymphs Abs: 1.6 K/uL (ref 0.7–4.0)
MCHC: 33.7 g/dL (ref 30.0–36.0)
MCV: 83.9 fl (ref 78.0–100.0)
Monocytes Absolute: 0.4 K/uL (ref 0.1–1.0)
Monocytes Relative: 6.1 % (ref 3.0–12.0)
Neutro Abs: 4.3 K/uL (ref 1.4–7.7)
Neutrophils Relative %: 65.6 % (ref 43.0–77.0)
Platelets: 193 K/uL (ref 150.0–400.0)
RBC: 4.8 Mil/uL (ref 4.22–5.81)
RDW: 14.2 % (ref 11.5–15.5)
WBC: 6.5 K/uL (ref 4.0–10.5)

## 2023-10-04 LAB — LIPID PANEL
Cholesterol: 131 mg/dL (ref 0–200)
HDL: 41.1 mg/dL (ref >39.00)
LDL Cholesterol: 70 mg/dL (ref 0–99)
NonHDL: 89.76
Total CHOL/HDL Ratio: 3
Triglycerides: 97 mg/dL (ref 0.0–149.0)
VLDL: 19.4 mg/dL (ref 0.0–40.0)

## 2023-10-04 LAB — HEMOGLOBIN A1C: Hgb A1c MFr Bld: 8.4 % — ABNORMAL HIGH (ref 4.6–6.5)

## 2023-10-04 LAB — TSH: TSH: 4.05 u[IU]/mL (ref 0.35–5.50)

## 2023-10-04 MED ORDER — LISINOPRIL-HYDROCHLOROTHIAZIDE 20-12.5 MG PO TABS: 1.0000 | ORAL_TABLET | Freq: Every day | ORAL | 1 refills | Status: AC

## 2023-10-04 MED ORDER — GLIPIZIDE ER 10 MG PO TB24: 10.0000 mg | ORAL_TABLET | Freq: Every day | ORAL | 1 refills | Status: AC

## 2023-10-04 MED ORDER — PANTOPRAZOLE SODIUM 40 MG PO TBEC: 40.0000 mg | DELAYED_RELEASE_TABLET | Freq: Every day | ORAL | 1 refills | Status: DC

## 2023-10-04 NOTE — Assessment & Plan Note (Signed)
 Chronic problem.  On Crestor 20mg  daily w/o difficulty.  Check labs.  Adjust meds prn

## 2023-10-04 NOTE — Patient Instructions (Signed)
 Follow up in 3 months to recheck sugar We'll notify you of your lab results and make any changes if needed Continue to work on healthy diet and regular exercise- you can do it! Call with any questions or concerns Stay Safe!  Stay Healthy! Enjoy the rest of your summer!!!

## 2023-10-04 NOTE — Assessment & Plan Note (Signed)
 BP is elevated today but he has not taken his medication.  Encouraged him to take meds prior to appts so we can get an accurate reading.  Currently asymptomatic.  Will check labs due to ACE and diuretic use but no anticipated med changes.

## 2023-10-04 NOTE — Assessment & Plan Note (Signed)
 Deteriorated.  Pt has gained 5 lbs since last visit.  BMI now 54.25  Stressed need for low carb diet and regular exercise.  Will continue to follow.

## 2023-10-04 NOTE — Progress Notes (Signed)
   Subjective:    Patient ID: George Christensen, male    DOB: 05/03/1950, 73 y.o.   MRN: [id number]  HPI DM- chronic problem.  Currently on Glipizide  10mg  daily.  Last A1C 7.9%.  UTD on eye exam, foot exam, microalbumin.  Denies symptomatic lows.  No numbness/tingling of hands/feet.  HTN- chronic problem, on Lisinopril  hydrochlorothiazide  20/12.5mg  daily and Amlodipine  5mg  daily.  BP is elevated today but he hasn't taken his medication yet (he brought it w/ him).  No CP, SOB, HA's, visual changes, edema.  Hyperlipidemia- chronic problem, on Crestor  20mg  daily.  No abd pain, N/V.  Obesity- pt has gained 5 lbs.  BMI 54.25  no regular exercise.   Review of Systems For ROS see HPI     Objective:   Physical Exam Vitals reviewed.  Constitutional:      General: He is not in acute distress.    Appearance: Normal appearance. He is well-developed. He is obese. He is not ill-appearing.  HENT:     Head: Normocephalic and atraumatic.  Eyes:     Extraocular Movements: Extraocular movements intact.     Conjunctiva/sclera: Conjunctivae normal.     Pupils: Pupils are equal, round, and reactive to light.  Neck:     Thyroid : No thyromegaly.  Cardiovascular:     Rate and Rhythm: Normal rate and regular rhythm.     Pulses: Normal pulses.     Heart sounds: Normal heart sounds. No murmur heard. Pulmonary:     Effort: Pulmonary effort is normal. No respiratory distress.     Breath sounds: Normal breath sounds.  Abdominal:     General: Bowel sounds are normal. There is no distension.     Palpations: Abdomen is soft.  Musculoskeletal:     Cervical back: Normal range of motion and neck supple.     Right lower leg: No edema.     Left lower leg: No edema.  Lymphadenopathy:     Cervical: No cervical adenopathy.  Skin:    General: Skin is warm and dry.  Neurological:     General: No focal deficit present.     Mental Status: He is alert and oriented to person, place, and time.     Cranial Nerves: No  cranial nerve deficit.  Psychiatric:        Mood and Affect: Mood normal.        Behavior: Behavior normal.           Assessment & Plan:

## 2023-10-04 NOTE — Assessment & Plan Note (Signed)
 Chronic problem.  Currently on Glipizide  10mg  daily w/o difficulty.  UTD on eye exam, foot exam, microalbumin.  Stressed need for low carb diet and regular exercise.  Check labs.  Adjust meds prn

## 2023-10-05 ENCOUNTER — Other Ambulatory Visit: Payer: Self-pay

## 2023-10-05 ENCOUNTER — Ambulatory Visit: Payer: Self-pay | Admitting: Family Medicine

## 2023-10-05 MED ORDER — SITAGLIPTIN PHOSPHATE 100 MG PO TABS: 100.0000 mg | ORAL_TABLET | Freq: Every day | ORAL | 0 refills | Status: DC

## 2023-10-05 NOTE — Progress Notes (Signed)
 Pt has been advised.

## 2023-10-05 NOTE — Progress Notes (Signed)
 Pt agreed to Januvia  100 mg and requested 90 day  Pt is asking if he needs to change anything concerning his Sodium levels.  Please advise.

## 2023-11-25 ENCOUNTER — Other Ambulatory Visit: Payer: Self-pay | Admitting: Family Medicine

## 2023-11-29 HISTORY — PX: CATARACT EXTRACTION: SUR2

## 2023-12-24 ENCOUNTER — Other Ambulatory Visit: Payer: Self-pay | Admitting: Family Medicine

## 2023-12-31 ENCOUNTER — Other Ambulatory Visit: Payer: Self-pay | Admitting: Family Medicine

## 2024-01-30 ENCOUNTER — Encounter: Payer: Self-pay | Admitting: Family Medicine

## 2024-01-30 ENCOUNTER — Ambulatory Visit: Admitting: Family Medicine

## 2024-01-30 VITALS — BP 126/78 | HR 72 | Temp 97.7°F | Ht 65.0 in | Wt 332.4 lb

## 2024-01-30 DIAGNOSIS — Z7984 Long term (current) use of oral hypoglycemic drugs: Secondary | ICD-10-CM

## 2024-01-30 DIAGNOSIS — E119 Type 2 diabetes mellitus without complications: Secondary | ICD-10-CM

## 2024-01-30 LAB — BASIC METABOLIC PANEL WITH GFR
BUN: 16 mg/dL (ref 6–23)
CO2: 31 meq/L (ref 19–32)
Calcium: 9.3 mg/dL (ref 8.4–10.5)
Chloride: 100 meq/L (ref 96–112)
Creatinine, Ser: 0.96 mg/dL (ref 0.40–1.50)
GFR: 78.92 mL/min
Glucose, Bld: 169 mg/dL — ABNORMAL HIGH (ref 70–99)
Potassium: 3.9 meq/L (ref 3.5–5.1)
Sodium: 138 meq/L (ref 135–145)

## 2024-01-30 LAB — HEMOGLOBIN A1C: Hgb A1c MFr Bld: 7.3 % — ABNORMAL HIGH (ref 4.6–6.5)

## 2024-01-30 NOTE — Assessment & Plan Note (Signed)
 Chronic problem.  Currently on Glipizide  and Januvia  w/o difficulty.  UTD on eye exam, foot exam, microalbumin.  Unfortunately he continues to gain weight.  Stressed need for low carb diet and regular exercise.  Check labs.  Adjust meds prn

## 2024-01-30 NOTE — Patient Instructions (Addendum)
 Schedule your complete physical in 3-4 months We'll notify you of your lab results and make any changes if needed Continue to work on healthy diet and regular exercise- you can do it! Call with any questions or concerns Stay Safe!  Stay Healthy! Merry Christmas!!!

## 2024-01-30 NOTE — Progress Notes (Signed)
   Subjective:    Patient ID: George Christensen, male    DOB: [birth year], 73 y.o.   MRN: [id number]  HPI DM- chronic problem.  On Glipizide  10mg  daily, Januvia  100mg  daily.  Has been intolerant to Metformin  in the past.  UTD on foot exam, eye exam, microalbumin.  Pt is up 6 lbs since August.  No CP, SOB, HA's, abd pain, N/V.  No numbness/tingling of hands/feet.  Denies symptomatic lows.     Review of Systems For ROS see HPI     Objective:   Physical Exam Vitals reviewed.  Constitutional:      General: He is not in acute distress.    Appearance: Normal appearance. He is well-developed. He is obese. He is not ill-appearing.  HENT:     Head: Normocephalic and atraumatic.  Eyes:     Extraocular Movements: Extraocular movements intact.     Conjunctiva/sclera: Conjunctivae normal.     Pupils: Pupils are equal, round, and reactive to light.  Neck:     Thyroid : No thyromegaly.  Cardiovascular:     Rate and Rhythm: Normal rate and regular rhythm.     Pulses: Normal pulses.     Heart sounds: Normal heart sounds. No murmur heard. Pulmonary:     Effort: Pulmonary effort is normal. No respiratory distress.     Breath sounds: Normal breath sounds.  Abdominal:     General: Bowel sounds are normal. There is no distension.     Palpations: Abdomen is soft.  Musculoskeletal:     Cervical back: Normal range of motion and neck supple.     Right lower leg: No edema.     Left lower leg: No edema.  Lymphadenopathy:     Cervical: No cervical adenopathy.  Skin:    General: Skin is warm and dry.  Neurological:     General: No focal deficit present.     Mental Status: He is alert and oriented to person, place, and time.     Cranial Nerves: No cranial nerve deficit.  Psychiatric:        Mood and Affect: Mood normal.        Behavior: Behavior normal.           Assessment & Plan:

## 2024-01-31 ENCOUNTER — Ambulatory Visit: Payer: Self-pay | Admitting: Family Medicine

## 2024-03-28 ENCOUNTER — Other Ambulatory Visit: Payer: Self-pay | Admitting: Family Medicine

## 2024-04-01 ENCOUNTER — Other Ambulatory Visit: Payer: Self-pay | Admitting: Family Medicine

## 2024-05-14 ENCOUNTER — Encounter: Admitting: Family Medicine
